# Patient Record
Sex: Male | Born: 1935 | Race: White | Hispanic: No | Smoking: Former smoker
Health system: Southern US, Community
[De-identification: ages and names within clinical notes are randomized; demographics above are authoritative.]

## PROBLEM LIST (undated history)

## (undated) DIAGNOSIS — I2510 Atherosclerotic heart disease of native coronary artery without angina pectoris: Secondary | ICD-10-CM

## (undated) DIAGNOSIS — F0280 Dementia in other diseases classified elsewhere without behavioral disturbance: Secondary | ICD-10-CM

## (undated) HISTORY — PX: CORONARY ANGIOPLASTY WITH STENT PLACEMENT: SHX49

---

## 1999-03-02 DIAGNOSIS — M6281 Muscle weakness (generalized): Secondary | ICD-10-CM | POA: Insufficient documentation

## 1999-03-02 DIAGNOSIS — Z955 Presence of coronary angioplasty implant and graft: Secondary | ICD-10-CM | POA: Insufficient documentation

## 2005-03-01 HISTORY — PX: IR TRANSCATHETER BX: IMG713

## 2009-03-01 HISTORY — PX: OTHER SURGICAL HISTORY: SHX169

## 2015-09-17 DIAGNOSIS — N400 Enlarged prostate without lower urinary tract symptoms: Secondary | ICD-10-CM | POA: Insufficient documentation

## 2016-02-16 DIAGNOSIS — E782 Mixed hyperlipidemia: Secondary | ICD-10-CM | POA: Insufficient documentation

## 2016-02-16 DIAGNOSIS — F411 Generalized anxiety disorder: Secondary | ICD-10-CM | POA: Insufficient documentation

## 2017-03-08 DIAGNOSIS — I10 Essential (primary) hypertension: Secondary | ICD-10-CM | POA: Insufficient documentation

## 2017-03-11 DIAGNOSIS — F1490 Cocaine use, unspecified, uncomplicated: Secondary | ICD-10-CM | POA: Insufficient documentation

## 2019-07-17 ENCOUNTER — Encounter (HOSPITAL_COMMUNITY): Payer: Self-pay

## 2019-07-17 ENCOUNTER — Emergency Department (HOSPITAL_COMMUNITY)
Admission: EM | Admit: 2019-07-17 | Discharge: 2019-07-17 | Disposition: A | Discharge status: Home / Self Care | Payer: Medicare Other | Attending: Emergency Medicine | Admitting: Emergency Medicine

## 2019-07-17 ENCOUNTER — Other Ambulatory Visit: Payer: Self-pay

## 2019-07-17 ENCOUNTER — Emergency Department (HOSPITAL_COMMUNITY): Payer: Medicare Other

## 2019-07-17 DIAGNOSIS — R079 Chest pain, unspecified: Secondary | ICD-10-CM

## 2019-07-17 DIAGNOSIS — R0789 Other chest pain: Secondary | ICD-10-CM | POA: Diagnosis not present

## 2019-07-17 DIAGNOSIS — Z87891 Personal history of nicotine dependence: Secondary | ICD-10-CM | POA: Insufficient documentation

## 2019-07-17 LAB — COMPREHENSIVE METABOLIC PANEL
ALT: 53 U/L — ABNORMAL HIGH (ref 0–44)
AST: 54 U/L — ABNORMAL HIGH (ref 15–41)
Albumin: 3.9 g/dL (ref 3.5–5.0)
Alkaline Phosphatase: 71 U/L (ref 38–126)
Anion gap: 10 (ref 5–15)
BUN: 19 mg/dL (ref 8–23)
CO2: 26 mmol/L (ref 22–32)
Calcium: 9.1 mg/dL (ref 8.9–10.3)
Chloride: 103 mmol/L (ref 98–111)
Creatinine, Ser: 1.33 mg/dL — ABNORMAL HIGH (ref 0.61–1.24)
GFR calc Af Amer: 57 mL/min — ABNORMAL LOW (ref >60)
GFR calc non Af Amer: 49 mL/min — ABNORMAL LOW (ref >60)
Glucose, Bld: 92 mg/dL (ref 70–99)
Potassium: 4.4 mmol/L (ref 3.5–5.1)
Sodium: 139 mmol/L (ref 135–145)
Total Bilirubin: 1 mg/dL (ref 0.3–1.2)
Total Protein: 7.5 g/dL (ref 6.5–8.1)

## 2019-07-17 LAB — CBC WITH DIFFERENTIAL/PLATELET
Abs Immature Granulocytes: 0.05 10*3/uL (ref 0.00–0.07)
Basophils Absolute: 0.1 10*3/uL (ref 0.0–0.1)
Basophils Relative: 1 %
Eosinophils Absolute: 0.2 10*3/uL (ref 0.0–0.5)
Eosinophils Relative: 3 %
HCT: 52.3 % — ABNORMAL HIGH (ref 39.0–52.0)
Hemoglobin: 16.9 g/dL (ref 13.0–17.0)
Immature Granulocytes: 1 %
Lymphocytes Relative: 39 %
Lymphs Abs: 3.1 10*3/uL (ref 0.7–4.0)
MCH: 30.6 pg (ref 26.0–34.0)
MCHC: 32.3 g/dL (ref 30.0–36.0)
MCV: 94.6 fL (ref 80.0–100.0)
Monocytes Absolute: 0.7 10*3/uL (ref 0.1–1.0)
Monocytes Relative: 10 %
Neutro Abs: 3.7 10*3/uL (ref 1.7–7.7)
Neutrophils Relative %: 46 %
Platelets: 200 10*3/uL (ref 150–400)
RBC: 5.53 MIL/uL (ref 4.22–5.81)
RDW: 12 % (ref 11.5–15.5)
WBC: 7.8 10*3/uL (ref 4.0–10.5)
nRBC: 0 % (ref 0.0–0.2)

## 2019-07-17 LAB — TROPONIN I (HIGH SENSITIVITY)
Troponin I (High Sensitivity): 5 ng/L (ref <18)
Troponin I (High Sensitivity): 5 ng/L (ref <18)

## 2019-07-17 MED ORDER — PANTOPRAZOLE SODIUM 40 MG PO TBEC
40.0000 mg | DELAYED_RELEASE_TABLET | Freq: Once | ORAL | Status: AC
  Administered 2019-07-17: 40 mg via ORAL
  Filled 2019-07-17: qty 1

## 2019-07-17 MED ORDER — PANTOPRAZOLE SODIUM 40 MG PO TBEC
40.0000 mg | DELAYED_RELEASE_TABLET | Freq: Every day | ORAL | 1 refills | Status: DC
Start: 2019-07-17 — End: 2019-08-06

## 2019-07-17 NOTE — Discharge Instructions (Addendum)
Take protonix as directed.  Return if any problems

## 2019-07-17 NOTE — ED Triage Notes (Signed)
Pt reports for the past few days has been waking up with indigestion.  Says symptoms get better after he gets up and moves around for a while.  Reports his step daughter checked his bp and reports it was low.  Pt unaware of what his bp reading was.  Pt says he feels "fine."  Denies any complaints.

## 2019-07-17 NOTE — ED Provider Notes (Signed)
[facility] EMERGENCY DEPARTMENT Provider Note   CSN: [id number] Arrival date & time: 07/17/19  1528     History Chief Complaint  Patient presents with  . Chest Pain    Frank West is a 84 y.o. male.  The history is provided by the patient. No language interpreter was used.  Chest Pain Pain location:  R chest Pain quality: aching   Pain radiates to:  Epigastrium Timing:  Sporadic Progression:  Resolved Chronicity:  Recurrent Context comment:  Waking from sleep Relieved by:  Nothing Worsened by:  Nothing Ineffective treatments:  None tried Associated symptoms: no abdominal pain and no altered mental status   Risk factors: no diabetes mellitus and no hypertension   Pt complains of having a discomfort in his chest in the am.      History reviewed. No pertinent past medical history.  There are no problems to display for this patient.   Past Surgical History:  Procedure Laterality Date  . CORONARY ANGIOPLASTY WITH STENT PLACEMENT         No family history on file.  Social History   Tobacco Use  . Smoking status: Former Research scientist (life sciences)  . Smokeless tobacco: Never Used  Substance Use Topics  . Alcohol use: Never  . Drug use: Never    Home Medications Prior to Admission medications   Medication Sig Start Date End Date Taking? Authorizing Provider  pantoprazole (PROTONIX) 40 MG tablet Take 1 tablet (40 mg total) by mouth daily. 07/17/19 07/16/20  Fransico Meadow, PA-C    Allergies    Patient has no known allergies.  Review of Systems   Review of Systems  Cardiovascular: Positive for chest pain.  Gastrointestinal: Negative for abdominal pain.  All other systems reviewed and are negative.   Physical Exam Updated Vital Signs BP (!) 156/89   Pulse 66   Temp 98.2 F (36.8 C) (Oral)   Resp 19   Ht 5\' 11"  (1.803 m)   Wt 81.6 kg   SpO2 99%   BMI 25.10 kg/m   Physical Exam Vitals and nursing note reviewed.  Constitutional:      Appearance: He is  well-developed.  HENT:     Head: Normocephalic.  Cardiovascular:     Rate and Rhythm: Normal rate.     Heart sounds: Normal heart sounds.  Pulmonary:     Effort: Pulmonary effort is normal.     Breath sounds: Normal breath sounds.  Abdominal:     General: There is no distension.  Musculoskeletal:        General: Normal range of motion.     Cervical back: Normal range of motion.  Skin:    General: Skin is warm.  Neurological:     General: No focal deficit present.     Mental Status: He is alert and oriented to person, place, and time.     ED Results / Procedures / Treatments   Labs (all labs ordered are listed, but only abnormal results are displayed) Labs Reviewed  CBC WITH DIFFERENTIAL/PLATELET - Abnormal; Notable for the following components:      Result Value   HCT 52.3 (*)    All other components within normal limits  COMPREHENSIVE METABOLIC PANEL - Abnormal; Notable for the following components:   Creatinine, Ser 1.33 (*)    AST 54 (*)    ALT 53 (*)    GFR calc non Af Amer 49 (*)    GFR calc Af Amer 57 (*)    All other  components within normal limits  TROPONIN I (HIGH SENSITIVITY)  TROPONIN I (HIGH SENSITIVITY)    EKG EKG Interpretation  Date/Time:  Tuesday Jul 17 2019 15:59:13 EDT Ventricular Rate:  65 PR Interval:    QRS Duration: 94 QT Interval:  440 QTC Calculation: 458 R Axis:   -37 Text Interpretation: Sinus rhythm Prolonged PR interval Left axis deviation Low voltage, precordial leads Posterior infarct, old Baseline wander in lead(s) V6 No STEMI Confirmed by Alona Bene [phone]) on 07/17/2019 4:13:59 PM   Radiology DG Chest Port 1 View  Result Date: 07/17/2019 CLINICAL DATA:  Chest pain EXAM: PORTABLE CHEST 1 VIEW COMPARISON:  None. FINDINGS: 1707 hours. Low lung volumes. The cardiopericardial silhouette is within normal limits for size. Interstitial markings are diffusely coarsened with chronic features. The lungs are clear without focal pneumonia,  edema, pneumothorax or pleural effusion. The visualized bony structures of the thorax are intact. Telemetry leads overlie the chest. IMPRESSION: No active disease. Electronically Signed   By: Kennith Center M.D.   On: 07/17/2019 17:24    Procedures Procedures (including critical care time)  Medications Ordered in ED Medications  pantoprazole (PROTONIX) EC tablet 40 mg (40 mg Oral Given 07/17/19 1851)    ED Course  I have reviewed the triage vital signs and the nursing notes.  Pertinent labs & imaging results that were available during my care of the patient were reviewed by me and considered in my medical decision making (see chart for details).    MDM Rules/Calculators/A&P                      MDM: Pt's ekg no acute, chest xray is normal labs are normal.  Dr. Jacqulyn Bath in to see and examine   Pt referred to cardiology for on going care.  Final Clinical Impression(s) / ED Diagnoses Final diagnoses:  Nonspecific chest pain    Rx / DC Orders ED Discharge Orders         Ordered    Ambulatory referral to Cardiology    Comments: Please call daughter [name] at [phone] to schedule appointment.   07/17/19 1829    pantoprazole (PROTONIX) 40 MG tablet  Daily     07/17/19 1837        An After Visit Summary was printed and given to the patient.    Elson Areas, Cordelia Poche 07/17/19 2306    Maia Plan, MD 07/18/19 [phone]

## 2019-07-17 NOTE — ED Notes (Signed)
Pt reports he had a stent placed a few years ago and was put on what he thinks was a blood thinner.  Pt says he stopped taking them when he ran out 2 years ago.  Pt says he doesn't have a doctor.

## 2019-08-06 ENCOUNTER — Other Ambulatory Visit: Payer: Self-pay

## 2019-08-06 ENCOUNTER — Ambulatory Visit (INDEPENDENT_AMBULATORY_CARE_PROVIDER_SITE_OTHER): Payer: Medicare Other | Admitting: Cardiology

## 2019-08-06 ENCOUNTER — Encounter: Payer: Self-pay | Admitting: Cardiology

## 2019-08-06 ENCOUNTER — Encounter: Payer: Self-pay | Admitting: *Deleted

## 2019-08-06 VITALS — BP 116/84 | HR 75 | Ht 71.5 in | Wt 199.2 lb

## 2019-08-06 DIAGNOSIS — I2510 Atherosclerotic heart disease of native coronary artery without angina pectoris: Secondary | ICD-10-CM

## 2019-08-06 DIAGNOSIS — Z955 Presence of coronary angioplasty implant and graft: Secondary | ICD-10-CM

## 2019-08-06 NOTE — Progress Notes (Signed)
     Clinical Summary Mr. Noguez is a 84 y.o.male seen today as a new patient for the following medical problems.    1. Chest pain - seen in ER 06/2019 with chest pain - trops neg x 2. EKG SR, no acute ischemic changes. CXR no acute process  - he reports stent placed in [place], he believes [place]. He reports received a stent. Believes was placed in 2017.   - he reports he did not have chest pain for ER visit.  - nonspecific feeling midchest/epigastrium, no other symptoms. Better with walking around. Lasted a few minutes. Mainly happens with waking up in the morning - walks dog regularly 3-4 per day, up to 30 minutes. No exertional symptoms - did not start protonix        PMH - self reported history of CAD   No Known Allergies   Current Outpatient Medications  Medication Sig Dispense Refill  . pantoprazole (PROTONIX) 40 MG tablet Take 1 tablet (40 mg total) by mouth daily. 30 tablet 1   No current facility-administered medications for this visit.     Past Surgical History:  Procedure Laterality Date  . CORONARY ANGIOPLASTY WITH STENT PLACEMENT       No Known Allergies    No family history on file.   Social History Mr. Desroches reports that he has quit smoking. He has never used smokeless tobacco. Mr. Auxier reports no history of alcohol use.   Review of Systems CONSTITUTIONAL: No weight loss, fever, chills, weakness or fatigue.  HEENT: Eyes: No visual loss, blurred vision, double vision or yellow sclerae.No hearing loss, sneezing, congestion, runny nose or sore throat.  SKIN: No rash or itching.  CARDIOVASCULAR: per hpi RESPIRATORY: No shortness of breath, cough or sputum.  GASTROINTESTINAL: No anorexia, nausea, vomiting or diarrhea. No abdominal pain or blood.  GENITOURINARY: No burning on urination, no polyuria NEUROLOGICAL: No headache, dizziness, syncope, paralysis, ataxia, numbness or tingling in the extremities. No change in bowel or bladder  control.  MUSCULOSKELETAL: No muscle, back pain, joint pain or stiffness.  LYMPHATICS: No enlarged nodes. No history of splenectomy.  PSYCHIATRIC: No history of depression or anxiety.  ENDOCRINOLOGIC: No reports of sweating, cold or heat intolerance. No polyuria or polydipsia.  Marland Kitchen   Physical Examination Today's Vitals   08/06/19 1500  BP: 116/84  Pulse: 75  SpO2: 95%  Weight: 199 lb 3.2 oz (90.4 kg)  Height: 5' 11.5" (1.816 m)   Body mass index is 27.4 kg/m.  Gen: resting comfortably, no acute distress HEENT: no scleral icterus, pupils equal round and reactive, no palptable cervical adenopathy,  CV: RRR, no m/r/g, no jvd Resp: Clear to auscultation bilaterally GI: abdomen is soft, non-tender, non-distended, normal bowel sounds, no hepatosplenomegaly MSK: extremities are warm, no edema.  Skin: warm, no rash Neuro:  no focal deficits Psych: appropriate affect     Assessment and Plan  1. Chest pain/CAD - self reported history of CAD, reports stent around 2017 in [place]. He does not know any details, thinks may have been either in [place] or [place]. We will see if we are able to hunt anything down - recent symptoms are non cardiac, no plans for ischemic testing - he is not on any meds. Start ASA 81mg  daily. Check lipid panel, will need a statin pending results.          , M.D

## 2019-08-06 NOTE — Patient Instructions (Addendum)
Your physician wants you to follow-up in: 6 MONTHS WITH [facility]  Your physician has recommended you make the following change in your medication:  START ASPIRIN 81 MG DAILY   Your physician recommends that you return for lab work LIPIDS - PLEASE FAST 6-8 HOURS PRIOR TO LAB WORK   Thank you for choosing [facility]!!

## 2020-02-21 ENCOUNTER — Ambulatory Visit: Payer: Medicare Other | Admitting: Cardiology

## 2020-02-21 NOTE — Progress Notes (Deleted)
     Clinical Summary Frank West is a 84 y.o.male   1. Chest pain - seen in ER 06/2019 with chest pain - trops neg x 2. EKG SR, no acute ischemic changes. CXR no acute process  - he reports stent placed in [place], he believes [place]. He reports received a stent. Believes was placed in 2017.   - he reports he did not have chest pain for ER visit.  - nonspecific feeling midchest/epigastrium, no other symptoms. Better with walking around. Lasted a few minutes. Mainly happens with waking up in the morning - walks dog regularly 3-4 per day, up to 30 minutes. No exertional symptoms - did not start protonix  No past medical history on file.   No Known Allergies   Current Outpatient Medications  Medication Sig Dispense Refill  . aspirin EC 81 MG tablet Take 81 mg by mouth daily.     No current facility-administered medications for this visit.     Past Surgical History:  Procedure Laterality Date  . CORONARY ANGIOPLASTY WITH STENT PLACEMENT    . IR TRANSCATHETER BX  2007   done in [place]   . uroscopy  2011     No Known Allergies    Family History  Problem Relation Age of Onset  . Melanoma Father   . Obesity Brother      Social History [name] reports that he has quit smoking. He has never used smokeless tobacco. [name] reports no history of alcohol use.   Review of Systems CONSTITUTIONAL: No weight loss, fever, chills, weakness or fatigue.  HEENT: Eyes: No visual loss, blurred vision, double vision or yellow sclerae.No hearing loss, sneezing, congestion, runny nose or sore throat.  SKIN: No rash or itching.  CARDIOVASCULAR:  RESPIRATORY: No shortness of breath, cough or sputum.  GASTROINTESTINAL: No anorexia, nausea, vomiting or diarrhea. No abdominal pain or blood.  GENITOURINARY: No burning on urination, no polyuria NEUROLOGICAL: No headache, dizziness, syncope, paralysis, ataxia, numbness or tingling in the extremities. No change in bowel or bladder  control.  MUSCULOSKELETAL: No muscle, back pain, joint pain or stiffness.  LYMPHATICS: No enlarged nodes. No history of splenectomy.  PSYCHIATRIC: No history of depression or anxiety.  ENDOCRINOLOGIC: No reports of sweating, cold or heat intolerance. No polyuria or polydipsia.  Marland Kitchen   Physical Examination There were no vitals filed for this visit. There were no vitals filed for this visit.  Gen: resting comfortably, no acute distress HEENT: no scleral icterus, pupils equal round and reactive, no palptable cervical adenopathy,  CV Resp: Clear to auscultation bilaterally GI: abdomen is soft, non-tender, non-distended, normal bowel sounds, no hepatosplenomegaly MSK: extremities are warm, no edema.  Skin: warm, no rash Neuro:  no focal deficits Psych: appropriate affect   Diagnostic Studies     Assessment and Plan  1. Chest pain/CAD - self reported history of CAD, reports stent around 2017 in [place]. He does not know any details, thinks may have been either in [place] or [place]. We will see if we are able to hunt anything down - recent symptoms are non cardiac, no plans for ischemic testing - he is not on any meds. Start ASA 81mg  daily. Check lipid panel, will need a statin pending results.       , M.D., F.A.C.C.

## 2021-07-14 IMAGING — DX DG CHEST 1V PORT
1 series · 1 of 1 positions shown · non-contrast
Comparison: None.

CLINICAL DATA: Chest pain

EXAM:
PORTABLE CHEST 1 VIEW

[chest ap]
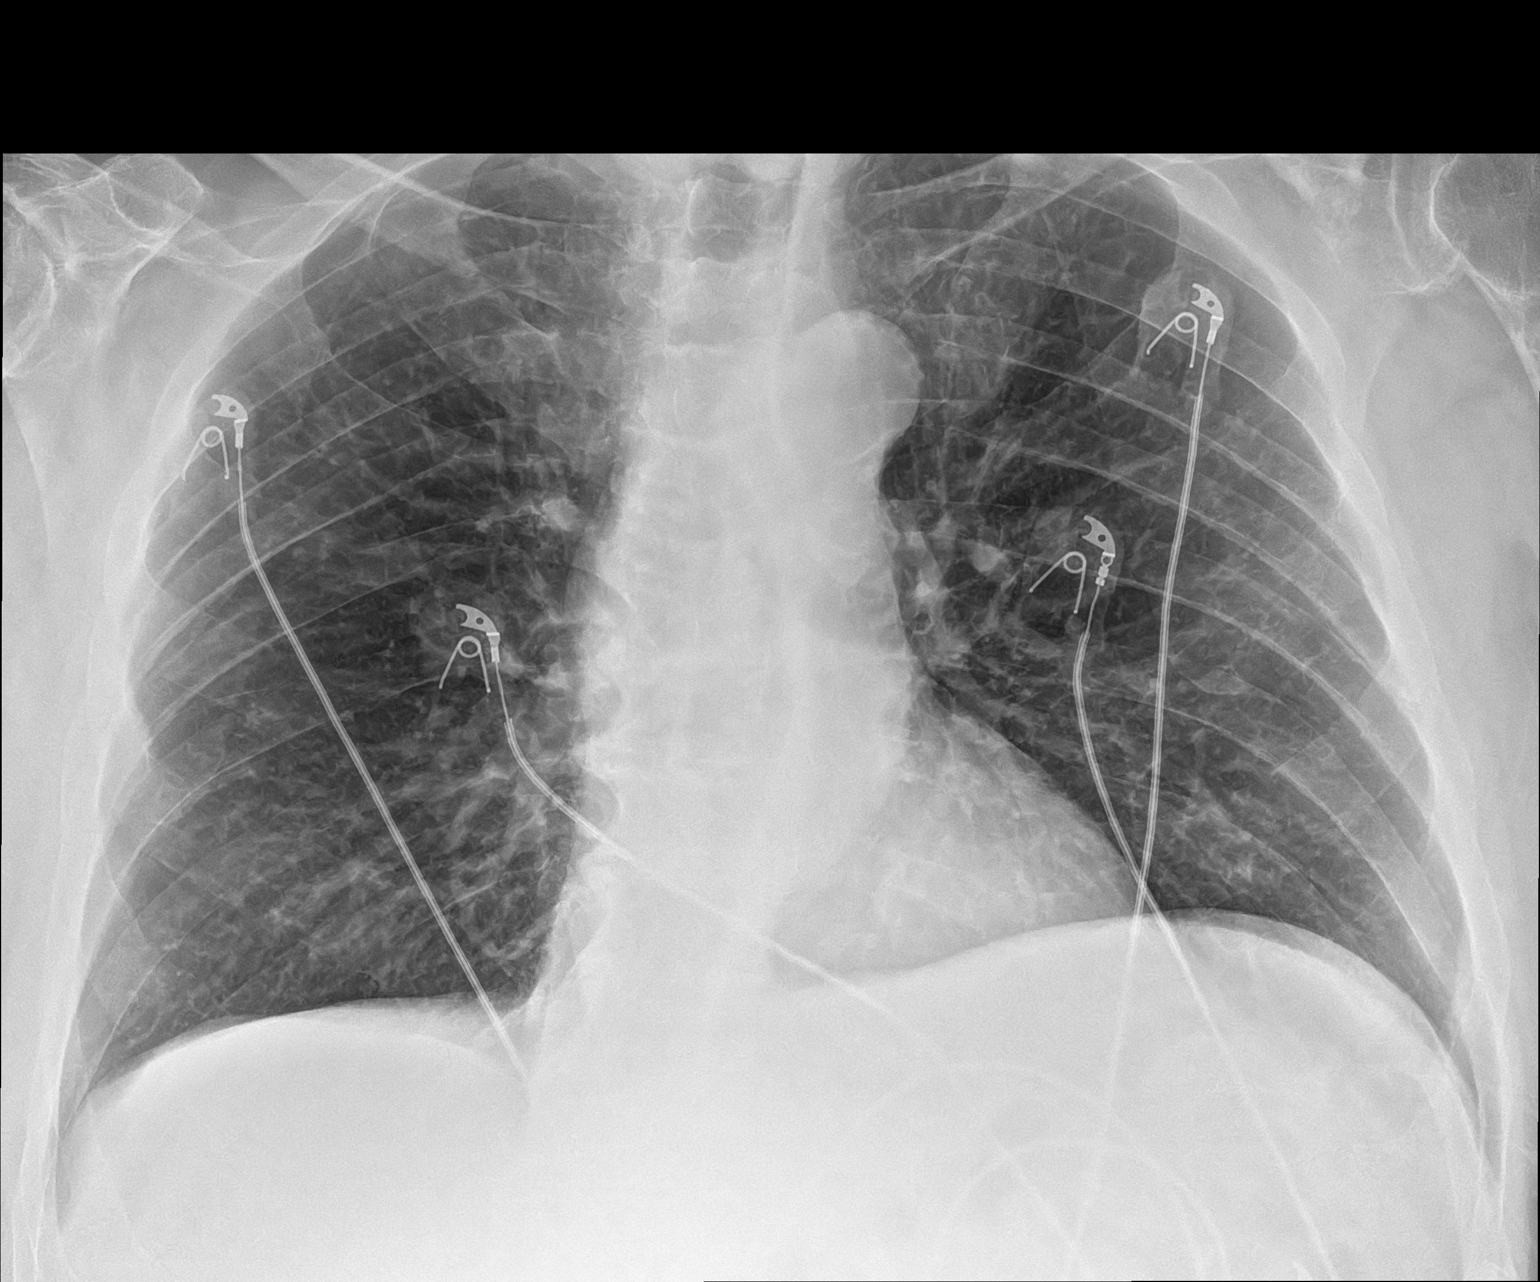

[1 of 1 positions shown; findings below may reference images not displayed]

FINDINGS: 2909 hours. Low lung volumes. The cardiopericardial silhouette is
within normal limits for size. Interstitial markings are diffusely
coarsened with chronic features. The lungs are clear without focal
pneumonia, edema, pneumothorax or pleural effusion. The visualized
bony structures of the thorax are intact. Telemetry leads overlie
the chest.
IMPRESSION: No active disease.

## 2022-09-23 ENCOUNTER — Other Ambulatory Visit: Payer: Self-pay

## 2022-09-23 ENCOUNTER — Emergency Department (HOSPITAL_COMMUNITY): Payer: Medicare Other

## 2022-09-23 ENCOUNTER — Inpatient Hospital Stay (HOSPITAL_COMMUNITY)
Admission: EM | Admit: 2022-09-23 | Discharge: 2022-09-29 | DRG: 522 | Disposition: A | Discharge status: Skilled Nursing Facility | Payer: Medicare Other | Attending: Family Medicine | Admitting: Family Medicine

## 2022-09-23 ENCOUNTER — Encounter (HOSPITAL_COMMUNITY): Payer: Self-pay | Admitting: Emergency Medicine

## 2022-09-23 DIAGNOSIS — W010XXA Fall on same level from slipping, tripping and stumbling without subsequent striking against object, initial encounter: Secondary | ICD-10-CM | POA: Diagnosis present

## 2022-09-23 DIAGNOSIS — R7401 Elevation of levels of liver transaminase levels: Secondary | ICD-10-CM | POA: Diagnosis present

## 2022-09-23 DIAGNOSIS — G308 Other Alzheimer's disease: Secondary | ICD-10-CM | POA: Diagnosis not present

## 2022-09-23 DIAGNOSIS — F02A0 Dementia in other diseases classified elsewhere, mild, without behavioral disturbance, psychotic disturbance, mood disturbance, and anxiety: Secondary | ICD-10-CM | POA: Diagnosis present

## 2022-09-23 DIAGNOSIS — I493 Ventricular premature depolarization: Secondary | ICD-10-CM | POA: Diagnosis present

## 2022-09-23 DIAGNOSIS — Z87891 Personal history of nicotine dependence: Secondary | ICD-10-CM | POA: Diagnosis not present

## 2022-09-23 DIAGNOSIS — Z955 Presence of coronary angioplasty implant and graft: Secondary | ICD-10-CM | POA: Diagnosis not present

## 2022-09-23 DIAGNOSIS — D696 Thrombocytopenia, unspecified: Secondary | ICD-10-CM | POA: Diagnosis present

## 2022-09-23 DIAGNOSIS — R9431 Abnormal electrocardiogram [ECG] [EKG]: Secondary | ICD-10-CM | POA: Diagnosis present

## 2022-09-23 DIAGNOSIS — Z91199 Patient's noncompliance with other medical treatment and regimen due to unspecified reason: Secondary | ICD-10-CM | POA: Diagnosis not present

## 2022-09-23 DIAGNOSIS — I2510 Atherosclerotic heart disease of native coronary artery without angina pectoris: Secondary | ICD-10-CM | POA: Diagnosis present

## 2022-09-23 DIAGNOSIS — G309 Alzheimer's disease, unspecified: Secondary | ICD-10-CM | POA: Diagnosis present

## 2022-09-23 DIAGNOSIS — R079 Chest pain, unspecified: Secondary | ICD-10-CM | POA: Diagnosis not present

## 2022-09-23 DIAGNOSIS — S72001A Fracture of unspecified part of neck of right femur, initial encounter for closed fracture: Secondary | ICD-10-CM | POA: Diagnosis not present

## 2022-09-23 DIAGNOSIS — F0280 Dementia in other diseases classified elsewhere without behavioral disturbance: Secondary | ICD-10-CM | POA: Diagnosis present

## 2022-09-23 DIAGNOSIS — S72011A Unspecified intracapsular fracture of right femur, initial encounter for closed fracture: Secondary | ICD-10-CM | POA: Diagnosis present

## 2022-09-23 DIAGNOSIS — Y92009 Unspecified place in unspecified non-institutional (private) residence as the place of occurrence of the external cause: Secondary | ICD-10-CM

## 2022-09-23 DIAGNOSIS — F0284 Dementia in other diseases classified elsewhere, unspecified severity, with anxiety: Secondary | ICD-10-CM | POA: Diagnosis not present

## 2022-09-23 DIAGNOSIS — F0390 Unspecified dementia without behavioral disturbance: Secondary | ICD-10-CM | POA: Diagnosis not present

## 2022-09-23 DIAGNOSIS — Z01810 Encounter for preprocedural cardiovascular examination: Secondary | ICD-10-CM | POA: Diagnosis not present

## 2022-09-23 DIAGNOSIS — R008 Other abnormalities of heart beat: Secondary | ICD-10-CM | POA: Diagnosis present

## 2022-09-23 DIAGNOSIS — W19XXXA Unspecified fall, initial encounter: Secondary | ICD-10-CM | POA: Diagnosis not present

## 2022-09-23 HISTORY — DX: Atherosclerotic heart disease of native coronary artery without angina pectoris: I25.10

## 2022-09-23 HISTORY — DX: Dementia in other diseases classified elsewhere, unspecified severity, without behavioral disturbance, psychotic disturbance, mood disturbance, and anxiety: F02.80

## 2022-09-23 LAB — TYPE AND SCREEN
ABO/RH(D): O POS
Antibody Screen: NEGATIVE

## 2022-09-23 LAB — CBC WITH DIFFERENTIAL/PLATELET
Abs Immature Granulocytes: 0.1 10*3/uL — ABNORMAL HIGH (ref 0.00–0.07)
Basophils Absolute: 0.1 10*3/uL (ref 0.0–0.1)
Basophils Relative: 0 %
Eosinophils Absolute: 0.3 10*3/uL (ref 0.0–0.5)
Eosinophils Relative: 2 %
HCT: 50.4 % (ref 39.0–52.0)
Hemoglobin: 16.6 g/dL (ref 13.0–17.0)
Immature Granulocytes: 1 %
Lymphocytes Relative: 7 %
Lymphs Abs: 1 10*3/uL (ref 0.7–4.0)
MCH: 30.6 pg (ref 26.0–34.0)
MCHC: 32.9 g/dL (ref 30.0–36.0)
MCV: 93 fL (ref 80.0–100.0)
Monocytes Absolute: 0.8 10*3/uL (ref 0.1–1.0)
Monocytes Relative: 6 %
Neutro Abs: 12.5 10*3/uL — ABNORMAL HIGH (ref 1.7–7.7)
Neutrophils Relative %: 84 %
Platelets: 165 10*3/uL (ref 150–400)
RBC: 5.42 MIL/uL (ref 4.22–5.81)
RDW: 12.7 % (ref 11.5–15.5)
WBC: 14.7 10*3/uL — ABNORMAL HIGH (ref 4.0–10.5)
nRBC: 0 % (ref 0.0–0.2)

## 2022-09-23 LAB — PROTIME-INR
INR: 1.1 (ref 0.8–1.2)
Prothrombin Time: 14 seconds (ref 11.4–15.2)

## 2022-09-23 LAB — COMPREHENSIVE METABOLIC PANEL
ALT: 59 U/L — ABNORMAL HIGH (ref 0–44)
AST: 63 U/L — ABNORMAL HIGH (ref 15–41)
Albumin: 3.8 g/dL (ref 3.5–5.0)
Alkaline Phosphatase: 71 U/L (ref 38–126)
Anion gap: 11 (ref 5–15)
BUN: 15 mg/dL (ref 8–23)
CO2: 22 mmol/L (ref 22–32)
Calcium: 8.9 mg/dL (ref 8.9–10.3)
Chloride: 103 mmol/L (ref 98–111)
Creatinine, Ser: 1.21 mg/dL (ref 0.61–1.24)
GFR, Estimated: 59 mL/min — ABNORMAL LOW (ref >60)
Glucose, Bld: 125 mg/dL — ABNORMAL HIGH (ref 70–99)
Potassium: 4.1 mmol/L (ref 3.5–5.1)
Sodium: 136 mmol/L (ref 135–145)
Total Bilirubin: 1.2 mg/dL (ref 0.3–1.2)
Total Protein: 7.8 g/dL (ref 6.5–8.1)

## 2022-09-23 LAB — CK: Total CK: 86 U/L (ref 49–397)

## 2022-09-23 MED ORDER — POLYETHYLENE GLYCOL 3350 17 G PO PACK: 17.0000 g | PACK | Freq: Every day | ORAL | Status: DC | PRN

## 2022-09-23 MED ORDER — ACETAMINOPHEN 650 MG RE SUPP: 650.0000 mg | Freq: Four times a day (QID) | RECTAL | Status: DC | PRN

## 2022-09-23 MED ORDER — SODIUM CHLORIDE 0.9 % IV SOLN: INTRAVENOUS | Status: AC

## 2022-09-23 MED ORDER — MORPHINE SULFATE (PF) 2 MG/ML IV SOLN
2.0000 mg | INTRAVENOUS | Status: DC | PRN
  Administered 2022-09-23 (×2): 2 mg via INTRAVENOUS
  Filled 2022-09-23 (×2): qty 1

## 2022-09-23 MED ORDER — ACETAMINOPHEN 325 MG PO TABS
650.0000 mg | ORAL_TABLET | Freq: Four times a day (QID) | ORAL | Status: DC | PRN
  Administered 2022-09-26: 650 mg via ORAL
  Filled 2022-09-23: qty 2

## 2022-09-23 MED ORDER — MORPHINE SULFATE (PF) 2 MG/ML IV SOLN
2.0000 mg | Freq: Once | INTRAVENOUS | Status: AC
  Administered 2022-09-23: 2 mg via INTRAVENOUS
  Filled 2022-09-23: qty 1

## 2022-09-23 MED ORDER — OXYCODONE-ACETAMINOPHEN 5-325 MG PO TABS
1.0000 | ORAL_TABLET | Freq: Once | ORAL | Status: AC
  Administered 2022-09-23: 1 via ORAL
  Filled 2022-09-23: qty 1

## 2022-09-23 MED ORDER — HEPARIN SODIUM (PORCINE) 5000 UNIT/ML IJ SOLN
5000.0000 [IU] | Freq: Once | INTRAMUSCULAR | Status: AC
  Administered 2022-09-23: 5000 [IU] via SUBCUTANEOUS
  Filled 2022-09-23: qty 1

## 2022-09-23 NOTE — ED Notes (Signed)
Pt wet, cleaned and new linens. Male purewick applied. Pt restless and pain with slight movement. Trembling. Able to console. Asked dr for more pain meds.

## 2022-09-23 NOTE — Assessment & Plan Note (Deleted)
Status post mechanical fall.  CT right hip without contrast- Subcapital impaction fracture of the right femoral neck with superolateral displacement of the distal femur.At baseline ambulates without assistance or assistive devices. - EDP consulted Dr. Romeo Apple- will need right hip partial hip replacement bipolar replacement - NPO midnight - IV morphine 2mg  Q4hr prn - N/s 75cc/hr x 20hrs -Daughter is unaware of any cardiac history, patient denies cardiac history at this time, but has dementia.  Cardiology notes 2021-history of cardiac stents in [place]. 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

## 2022-09-23 NOTE — ED Notes (Signed)
Pt in ct 

## 2022-09-23 NOTE — ED Provider Notes (Signed)
[place] EMERGENCY DEPARTMENT AT [facility] Provider Note   CSN: [id number] Arrival date & time: 09/23/22  1231     History Chief Complaint  Patient presents with   Frank West is a 87 y.o. male.  Patient with past history significant for Alzheimer disease presents to the emergency department with concerns of a mechanical fall.  Reports that he tripped and fell and landed against a wall earlier this morning when he tried to get his dog outside. This was an unwitnessed fall at home. Endorses pain of the right hip and knee.  Patient's daughter is at bedside reports that patient has had 2 episodes of vomiting earlier this morning but unsure if this is related to the fall.  Patient's daughter reports that he was last seen around 9:30 PM last night and was found this morning on the floor around 9 AM.  Per doctor's report, patient is alert and oriented at baseline.   Fall       Home Medications Prior to Admission medications   Medication Sig Start Date End Date Taking? Authorizing Provider  ibuprofen (ADVIL) 200 MG tablet Take 600 mg by mouth every 6 (six) hours as needed for moderate pain.   Yes [provider]      Allergies    Patient has no known allergies.    Review of Systems   Review of Systems  Musculoskeletal:  Positive for joint swelling.  All other systems reviewed and are negative.   Physical Exam Updated Vital Signs BP (!) 173/90 (BP Location: Left Arm)   Pulse 84   Temp 98.1 F (36.7 C) (Oral)   Resp 18   Ht 5' 11.5" (1.816 m)   Wt 81 kg   SpO2 99%   BMI 24.56 kg/m  Physical Exam Vitals and nursing note reviewed.  Constitutional:      General: He is not in acute distress.    Appearance: He is well-developed.  HENT:     Head: Normocephalic and atraumatic.  Eyes:     Conjunctiva/sclera: Conjunctivae normal.  Cardiovascular:     Rate and Rhythm: Normal rate and regular rhythm.     Heart sounds: No murmur  heard. Pulmonary:     Effort: Pulmonary effort is normal. No respiratory distress.     Breath sounds: Normal breath sounds.  Abdominal:     Palpations: Abdomen is soft.     Tenderness: There is no abdominal tenderness.  Musculoskeletal:        General: Tenderness and signs of injury present. No swelling or deformity.     Cervical back: Neck supple.     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  Skin:    General: Skin is warm and dry.     Capillary Refill: Capillary refill takes less than 2 seconds.     Findings: Lesion present.          Comments: Multiple skin tears noted to the right elbow.  Superficial abrasion noted to the right knee.    Neurological:     Mental Status: He is alert.  Psychiatric:        Mood and Affect: Mood normal.     ED Results / Procedures / Treatments   Labs (all labs ordered are listed, but only abnormal results are displayed) Labs Reviewed  CBC WITH DIFFERENTIAL/PLATELET - Abnormal; Notable for the following components:  Result Value   WBC 14.7 (*)    Neutro Abs 12.5 (*)    Abs Immature Granulocytes 0.10 (*)    All other components within normal limits  COMPREHENSIVE METABOLIC PANEL - Abnormal; Notable for the following components:   Glucose, Bld 125 (*)    AST 63 (*)    ALT 59 (*)    GFR, Estimated 59 (*)    All other components within normal limits  PROTIME-INR  CK  URINALYSIS, ROUTINE W REFLEX MICROSCOPIC  BASIC METABOLIC PANEL  CBC  TYPE AND SCREEN    EKG None  Radiology CT Hip Right Wo Contrast  Result Date: 09/23/2022 CLINICAL DATA:  Patient fell today.  Right hip pain EXAM: CT OF THE RIGHT HIP WITHOUT CONTRAST TECHNIQUE: Multidetector CT imaging of the right hip was performed according to the standard protocol. Multiplanar CT image reconstructions were also generated. RADIATION DOSE REDUCTION: This exam was performed according  to the departmental dose-optimization program which includes automated exposure control, adjustment of the mA and/or kV according to patient size and/or use of iterative reconstruction technique. COMPARISON:  Radiograph performed earlier on the same date FINDINGS: Bones/Joint/Cartilage Subcapital impaction fracture of the right femoral neck with superolateral displacement of the distal femur. No joint effusion or intra-articular extension. Mild hip osteoarthritis. Sacroiliac joint and pubic symphysis are intact. Pubic bones are intact. Ligaments Suboptimally assessed by CT. Muscles and Tendons No intramuscular hematoma or fluid collection. Iliopsoas and hamstring tendons appear intact. Soft tissues Skin and subcutaneous soft tissues are within normal limits. IMPRESSION: Subcapital impaction fracture of the right femoral neck with superolateral displacement of the distal femur. Electronically Signed   By: Larose Hires D.O.   On: 09/23/2022 16:08   DG Hip Unilat W or Wo Pelvis 2-3 Views Right  Result Date: 09/23/2022 CLINICAL DATA:  Post fall, now with right hip pain. EXAM: DG HIP (WITH OR WITHOUT PELVIS) 2-3V RIGHT COMPARISON:  None Available. FINDINGS: There is a acute, impacted, foreshortened subcapsular right femoral neck fracture without definitive evidence of intra-articular extension. Mild degenerative change of the right hip is suspected with joint space loss, subchondral sclerosis and osteophytosis. No evidence of avascular necrosis Limited visualization of the pelvis is normal. Degenerative change of the contralateral left hip is suspected though incompletely evaluated. Degenerative change of the lower lumbar spine is suspected though incompletely evaluated Phleboliths overlie the right hemipelvis. Regional soft tissues appear otherwise normal. IMPRESSION: Acute, impacted, foreshortened subcapsular right femoral neck fracture without definitive evidence of intra-articular extension. Electronically Signed    By: Simonne Come M.D.   On: 09/23/2022 15:15   DG Knee Complete 4 Views Right  Result Date: 09/23/2022 CLINICAL DATA:  Post fall, now with right knee pain. EXAM: RIGHT KNEE - COMPLETE 4+ VIEW COMPARISON:  None Available. FINDINGS: Sequela of previous pin, cerclage wire and lag screw fixation of the patella. The superior aspect of the cerclage wire is fractured though this is favored to be chronic in etiology given associated lucency surrounding the fracture component of the cerclage wire. No definite acute fracture or dislocation. No knee joint effusion. Mild-to-moderate tricompartmental degenerative change of the knee is suspected though suboptimally evaluated due to obliquity. No evidence of chondrocalcinosis. Regional soft tissues appear normal. IMPRESSION: 1. No acute findings. 2. Sequela of previous pin, cerclage wire and lag screw fixation of the patella. Electronically Signed   By: Simonne Come M.D.   On: 09/23/2022 15:13   CT Head Wo Contrast  Result Date: 09/23/2022 CLINICAL DATA:  Head trauma, moderate-severe EXAM: CT HEAD WITHOUT CONTRAST TECHNIQUE: Contiguous axial images were obtained from the base of the skull through the vertex without intravenous contrast. RADIATION DOSE REDUCTION: This exam was performed according to the departmental dose-optimization program which includes automated exposure control, adjustment of the mA and/or kV according to patient size and/or use of iterative reconstruction technique. COMPARISON:  None Available. FINDINGS: Brain: No evidence of acute infarction, hemorrhage, hydrocephalus, extra-axial collection or mass lesion/mass effect. Vascular: No hyperdense vessel or unexpected calcification. Skull: Normal. Negative for fracture or focal lesion. Sinuses/Orbits: No middle ear or mastoid effusion. Paranasal sinuses are clear. Orbits are unremarkable. Other: None. IMPRESSION: No CT evidence of intracranial injury. Electronically Signed   By: Lorenza Cambridge M.D.   On:  09/23/2022 14:30    Procedures Procedures   Medications Ordered in ED Medications  morphine (PF) 2 MG/ML injection 2 mg (2 mg Intravenous Given 09/23/22 2116)  0.9 %  sodium chloride infusion ( Intravenous New Bag/Given 09/23/22 2008)  acetaminophen (TYLENOL) tablet 650 mg (has no administration in time range)    Or  acetaminophen (TYLENOL) suppository 650 mg (has no administration in time range)  polyethylene glycol (MIRALAX / GLYCOLAX) packet 17 g (has no administration in time range)  oxyCODONE-acetaminophen (PERCOCET/ROXICET) 5-325 MG per tablet 1 tablet (1 tablet Oral Given 09/23/22 1419)  morphine (PF) 2 MG/ML injection 2 mg (2 mg Intravenous Given 09/23/22 1721)  heparin injection 5,000 Units (5,000 Units Subcutaneous Given 09/23/22 2100)    ED Course/ Medical Decision Making/ A&P Clinical Course as of 09/23/22 2324  Thu Sep 23, 2022  1458 DG Hip Unilat W or Wo Pelvis 2-3 Views Right [DR]  1459 DG Hip Unilat W or Wo Pelvis 2-3 Views Right [DR]    Clinical Course User Index [DR] Margarita Grizzle, MD                           Medical Decision Making Amount and/or Complexity of Data Reviewed Labs: ordered. Radiology: ordered. Decision-making details documented in ED Course.  Risk Prescription drug management. Decision regarding hospitalization.   This patient presents to the ED for concern of fall. Differential diagnosis includes hip fracture, femur fracture, hip dislocation, UTI, sepsis   Lab Tests:  I Ordered, and personally interpreted labs.  The pertinent results include: Mild leukocytosis of 14.7, CMP with out significant electrolyte derangements, renal function largely intact with only slight decline in GFR at 59, AST and ALT elevated with AST at 63 and ALT at 59   Imaging Studies ordered:  I ordered imaging studies including x-ray of the right knee, x-ray right hip, CT head, CT right hip I independently visualized and interpreted imaging which showed no acute  findings in the right knee, subcapsular right femoral neck fracture, no acute intracranial abnormality I agree with the radiologist interpretation   Medicines ordered and prescription drug management:  I ordered medication including Percocet for pain Reevaluation of the patient after these medicines showed that the patient improved I have reviewed the patients home medicines and have made adjustments as needed   Problem List / ED Course:  Patient presents to the emergency department following a mechanical fall.  Reports that he tripped and fell was try to take his dog outside earlier today.  States that he hit the right hip and right knee against a wall in the home.  Patient's daughter at bedside reports that he was found on the ground at around 68  AM this morning.  No obvious deformity noted on initial examination but there is tenderness to gentle palpation along the right greater trochanter.  Given unclear mechanism and unknown duration the patient was seen on the floor, will image with CT head and CT hip as well as x-ray imaging of these areas and the x-ray of the right knee for evaluation of patient's symptoms.  Will treat with Percocet for adequate pain control.  Fall appears to sound mechanical in nature but will order basic labs as well for evaluation for any signs of infection or him.  Will assess renal function for possible electrolyte deficiency. Imaging concerning for supcapsular right femoral neck fracture. Will consult surgery for recommendations but given patient's age, do no anticipate surgery. Spoke with Dr. Romeo Apple, orthopedic surgery, who advised admission given that patient cannot ambulate. Does not anticipate surgical management, but will evaluate once admitted. Will consult hospitalist. Spoke with Dr. Wendall Stade, hospitalist, who will be admitting patient for continued management given non-weight bearing status.   Final Clinical Impression(s) / ED Diagnoses Final diagnoses:   Closed subcapital fracture of right femur, initial encounter [facility])    Rx / DC Orders ED Discharge Orders     None         Smitty Knudsen, PA-C 09/23/22 2324    Margarita Grizzle, MD 09/25/22 [phone]

## 2022-09-23 NOTE — ED Triage Notes (Addendum)
Pt fell today, tripped. Pt arrived a/o to most. C/o pain to right hip and knee. Hx of dementia. Daughter at bedside. 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 No rotation noted.bilateral pedal pulses faint but present.skin tear noted to right posterior fa. Denies hitting head but daughter states had vomited twice this morning.  Pt daughter last saw him at 930pm last night and found him in floor at 9am.  Pupills perrla, moving all extremities, equal hand grips and symmetrical smile. Small abrasion noted to right knee

## 2022-09-23 NOTE — H&P (Signed)
History and Physical    Frank West:[id number] DOB: August 27, 1935 DOA: 09/23/2022  PCP: Patient, No Pcp Per   Patient coming from: Home  I have personally briefly reviewed patient's old medical records in [facility]  Chief Complaint: Fall  HPI: Frank West is a 87 y.o. male with medical history significant for Alzheimer's.  Patient presented to the ED with reports of a fall.  Patient was last seen normal at about 9:30 PM.  This morning patient's daughter woke up and saw patient on the floor at about 9 AM.  Patient had gotten up to let the dog out probably about 8 AM, and falling in his bedroom, had his bed.  Patient reports he tripped.  He did not lose consciousness.  He denies chest pain, no difficulty breathing.  Vomited twice this morning due to severe pain.  He ambulates without assistance or assistive devices. Daughter reports frequent urination over the past year.  He denies any cardiac history procedures in the past.  Per cardiology notes 07/2019- he had stent placed in [place].  Per daughterFrench West at bedside, she does not know the details or if patient had any cardiac history.  ED Course: Stable vitals.  Head CT negative for acute abnormality, knee x-ray unremarkable.  CT right hip without contrast-shows subcapital impacted fracture of the right femoral neck. EDP talked to Ortho Dr. Romeo Apple, will see in consult.  N.p.o. midnight.  Review of Systems: As per HPI all other systems reviewed and negative.  Past Medical History:  Diagnosis Date   Alzheimer disease [facility])     Past Surgical History:  Procedure Laterality Date   CORONARY ANGIOPLASTY WITH STENT PLACEMENT     IR TRANSCATHETER BX  2007   done in [place]    uroscopy  2011     reports that he has quit smoking. He has never used smokeless tobacco. He reports that he does not drink alcohol and does not use drugs.  No Known Allergies  Family History  Problem Relation Age of Onset   Melanoma Father    Obesity  Brother     Prior to Admission medications   Medication Sig Start Date End Date Taking? Authorizing Provider  ibuprofen (ADVIL) 200 MG tablet Take 600 mg by mouth every 6 (six) hours as needed for moderate pain.   Yes [provider]    Physical Exam: Vitals:   09/23/22 1430 09/23/22 1458 09/23/22 1630 09/23/22 1715  BP:  (!) 148/89 (!) 182/82   Pulse: 78 (!) 54 93   Resp: 18 16 (!) 21   Temp:      TempSrc:      SpO2: 97% 95%  96%    Constitutional: NAD, calm, comfortable Vitals:   09/23/22 1430 09/23/22 1458 09/23/22 1630 09/23/22 1715  BP:  (!) 148/89 (!) 182/82   Pulse: 78 (!) 54 93   Resp: 18 16 (!) 21   Temp:      TempSrc:      SpO2: 97% 95%  96%   Eyes: PERRL, lids and conjunctivae normal ENMT: Mucous membranes are moist.   Neck: normal, supple, no masses, no thyromegaly Respiratory: clear to auscultation bilaterally, no wheezing, no crackles. Normal respiratory effort. No accessory muscle use.  Cardiovascular: Regular rate and rhythm, no murmurs / rubs / gallops. No extremity edema.  Abdomen: no tenderness, no masses palpated. No hepatosplenomegaly. Bowel sounds positive.  Musculoskeletal: no clubbing / cyanosis. No joint deformity upper and lower extremities.  Skin:  no rashes, lesions, ulcers. No induration Neurologic: No Facial asymmetry, speech fluent, right lower extremity not tested, moving extremities spontaneously Psychiatric: . Alert and oriented x 3. Normal mood.  Answers questions but tends to repeat his statements  Labs on Admission: I have personally reviewed following labs and imaging studies  CBC: Recent Labs  Lab 09/23/22 1429  WBC 14.7*  NEUTROABS 12.5*  HGB 16.6  HCT 50.4  MCV 93.0  PLT 165   Basic Metabolic Panel: Recent Labs  Lab 09/23/22 1429  NA 136  K 4.1  CL 103  CO2 22  GLUCOSE 125*  BUN 15  CREATININE 1.21  CALCIUM 8.9   GFR: CrCl cannot be calculated (Unknown ideal weight.). Liver Function Tests: Recent  Labs  Lab 09/23/22 1429  AST 63*  ALT 59*  ALKPHOS 71  BILITOT 1.2  PROT 7.8  ALBUMIN 3.8    Coagulation Profile: Recent Labs  Lab 09/23/22 1429  INR 1.1   Radiological Exams on Admission: CT Hip Right Wo Contrast  Result Date: 09/23/2022 CLINICAL DATA:  Patient fell today.  Right hip pain EXAM: CT OF THE RIGHT HIP WITHOUT CONTRAST TECHNIQUE: Multidetector CT imaging of the right hip was performed according to the standard protocol. Multiplanar CT image reconstructions were also generated. RADIATION DOSE REDUCTION: This exam was performed according to the departmental dose-optimization program which includes automated exposure control, adjustment of the mA and/or kV according to patient size and/or use of iterative reconstruction technique. COMPARISON:  Radiograph performed earlier on the same date FINDINGS: Bones/Joint/Cartilage Subcapital impaction fracture of the right femoral neck with superolateral displacement of the distal femur. No joint effusion or intra-articular extension. Mild hip osteoarthritis. Sacroiliac joint and pubic symphysis are intact. Pubic bones are intact. Ligaments Suboptimally assessed by CT. Muscles and Tendons No intramuscular hematoma or fluid collection. Iliopsoas and hamstring tendons appear intact. Soft tissues Skin and subcutaneous soft tissues are within normal limits. IMPRESSION: Subcapital impaction fracture of the right femoral neck with superolateral displacement of the distal femur. Electronically Signed   By: Larose Hires D.O.   On: 09/23/2022 16:08   DG Hip Unilat W or Wo Pelvis 2-3 Views Right  Result Date: 09/23/2022 CLINICAL DATA:  Post fall, now with right hip pain. EXAM: DG HIP (WITH OR WITHOUT PELVIS) 2-3V RIGHT COMPARISON:  None Available. FINDINGS: There is a acute, impacted, foreshortened subcapsular right femoral neck fracture without definitive evidence of intra-articular extension. Mild degenerative change of the right hip is suspected with  joint space loss, subchondral sclerosis and osteophytosis. No evidence of avascular necrosis Limited visualization of the pelvis is normal. Degenerative change of the contralateral left hip is suspected though incompletely evaluated. Degenerative change of the lower lumbar spine is suspected though incompletely evaluated Phleboliths overlie the right hemipelvis. Regional soft tissues appear otherwise normal. IMPRESSION: Acute, impacted, foreshortened subcapsular right femoral neck fracture without definitive evidence of intra-articular extension. Electronically Signed   By: Simonne Come M.D.   On: 09/23/2022 15:15   DG Knee Complete 4 Views Right  Result Date: 09/23/2022 CLINICAL DATA:  Post fall, now with right knee pain. EXAM: RIGHT KNEE - COMPLETE 4+ VIEW COMPARISON:  None Available. FINDINGS: Sequela of previous pin, cerclage wire and lag screw fixation of the patella. The superior aspect of the cerclage wire is fractured though this is favored to be chronic in etiology given associated lucency surrounding the fracture component of the cerclage wire. No definite acute fracture or dislocation. No knee joint effusion. Mild-to-moderate tricompartmental degenerative  change of the knee is suspected though suboptimally evaluated due to obliquity. No evidence of chondrocalcinosis. Regional soft tissues appear normal. IMPRESSION: 1. No acute findings. 2. Sequela of previous pin, cerclage wire and lag screw fixation of the patella. Electronically Signed   By: Simonne Come M.D.   On: 09/23/2022 15:13   CT Head Wo Contrast  Result Date: 09/23/2022 CLINICAL DATA:  Head trauma, moderate-severe EXAM: CT HEAD WITHOUT CONTRAST TECHNIQUE: Contiguous axial images were obtained from the base of the skull through the vertex without intravenous contrast. RADIATION DOSE REDUCTION: This exam was performed according to the departmental dose-optimization program which includes automated exposure control, adjustment of the mA  and/or kV according to patient size and/or use of iterative reconstruction technique. COMPARISON:  None Available. FINDINGS: Brain: No evidence of acute infarction, hemorrhage, hydrocephalus, extra-axial collection or mass lesion/mass effect. Vascular: No hyperdense vessel or unexpected calcification. Skull: Normal. Negative for fracture or focal lesion. Sinuses/Orbits: No middle ear or mastoid effusion. Paranasal sinuses are clear. Orbits are unremarkable. Other: None. IMPRESSION: No CT evidence of intracranial injury. Electronically Signed   By: Lorenza Cambridge M.D.   On: 09/23/2022 14:30    EKG: Independently reviewed.  Ventricular bigeminy, sinus rhythm rate 75, QTc 492.  Incomplete LBBB.  Assessment/Plan Principal Problem:   Closed subcapital fracture of neck of right femur [facility]) Active Problems:   Fall at home, initial encounter   Alzheimer's dementia (HCC)   Assessment and Plan: * Closed subcapital fracture of neck of right femur (HCC) Status post mechanical fall.  CT right hip without contrast- Subcapital impaction fracture of the right femoral neck with superolateral displacement of the distal femur.At baseline ambulates without assistance or assistive devices. - EDP consulted Dr. Romeo Apple- will need right hip partial hip replacement bipolar replacement - NPO midnight - IV morphine 2mg  Q4hr prn - N/s 75cc/hr x 20hrs -Daughter is unaware of any cardiac history, patient denies cardiac history at this time, but has dementia.  Cardiology notes 2021-history of cardiac stents in [place]. -EKG today with ventricular bigeminy, ILBBB, will consult cardiology for clearance    Fall at home, initial encounter Mechanical fall at home sustaining right femur fracture. Head CT negative for acute abnormality.  Right knee x-ray unremarkable.  Patient lives with his daughter. - CK -Check UA, daughter reports frequency   DVT prophylaxis:  Heparin X 1, SCDS Code Status: FULL code-confirmed with patient,  and daughter at bedside. Family Communication: Daughter Debbie at bedside Disposition Plan: > 2 days Consults called: Ortho Admission status: Inpt Tele  I certify that at the point of admission it is my clinical judgment that the patient will require inpatient hospital care spanning beyond 2 midnights from the point of admission due to high intensity of service, high risk for further deterioration and high frequency of surveillance required.   Author: Onnie Boer, MD 09/23/2022 6:46 PM  For on call review [email or web address].

## 2022-09-23 NOTE — ED Notes (Signed)
Pt returned from ct

## 2022-09-23 NOTE — Progress Notes (Signed)
Patient ID: Frank West, male   DOB: 01-31-1936, 87 y.o.   MRN: [id number]   87 year old male apparently has a cardiac stent fractured his right hip  He will need a right hip partial hip replacement bipolar replacement     Latest Ref Rng & Units 09/23/2022    2:29 PM 07/17/2019    4:33 PM  CBC  WBC 4.0 - 10.5 K/uL 14.7  7.8   Hemoglobin 13.0 - 17.0 g/dL 82.9  56.2   Hematocrit 39.0 - 52.0 % 50.4  52.3   Platelets 150 - 400 K/uL 165  200       Latest Ref Rng & Units 09/23/2022    2:29 PM 07/17/2019    4:33 PM  BMP  Glucose 70 - 99 mg/dL 130  92   BUN 8 - 23 mg/dL 15  19   Creatinine 8.65 - 1.24 mg/dL 7.84  6.96   Sodium 295 - 145 mmol/L 136  139   Potassium 3.5 - 5.1 mmol/L 4.1  4.4   Chloride 98 - 111 mmol/L 103  103   CO2 22 - 32 mmol/L 22  26   Calcium 8.9 - 10.3 mg/dL 8.9  9.1    Labs look good  N.p.o. after midnight

## 2022-09-23 NOTE — Consult Note (Signed)
HOSPITAL CONSULT  [facility]   Patient ID: Frank West, male   DOB: 1935-03-14, 87 y.o.   MRN: [id number]  New patient  Requested by:Dr Willeen Niece  Reason for: Fractured right hip  Based on the information below I recommend right bipolar hip replacement  Chief Complaint  Patient presents with   Fall     HPI  87 year old male fell at home and fractured his right hip.  He lives with his daughter.  He complains of severe pain in his right hip which started on September 24, 2018 felt the quality is dull but sharp when he moves and the severity is a 10.  It is preventing him from walking He does not use any assistive devices at home Review of Systems (all) Review of Systems  Constitutional:  Negative for fever.  Respiratory:  Negative for shortness of breath.   Cardiovascular:  Negative for chest pain.  Skin: Negative.   Neurological:  Negative for tingling and sensory change.    Past Medical History:  Diagnosis Date   Alzheimer disease [facility])     Past Surgical History:  Procedure Laterality Date   CORONARY ANGIOPLASTY WITH STENT PLACEMENT     IR TRANSCATHETER BX  2007   done in [place]    uroscopy  2011    Family History  Problem Relation Age of Onset   Melanoma Father    Obesity Brother    Social History   Tobacco Use   Smoking status: Former   Smokeless tobacco: Never  Substance Use Topics   Alcohol use: Never   Drug use: Never   No Known Allergies No current facility-administered medications for this encounter.  Current Outpatient Medications:    ibuprofen (ADVIL) 200 MG tablet, Take 600 mg by mouth every 6 (six) hours as needed for moderate pain., Disp: , Rfl:     Physical Exam(=30) BP (!) 148/89   Pulse (!) 54   Temp 97.6 F (36.4 C) (Oral)   Resp 16   SpO2 95%   Gen. Appearance he appears to be well-developed and well-nourished grooming and hygiene are normal Peripheral vascular system no peripheral edema normal pulses Lymph nodes  ARE NORMAL  Gait currently unable to walk because of the fracture  Right lower extremity Inspection reveals shortening of the right lower extremity with some external rotation, tenderness over the right hip especially the greater trochanter and proximal thigh Range of motion is deferred because of the fracture Stability tests were also deferred because of the fracture Muscle strength and tone were normal. Skin was warm dry intact normal no erythema in the lower extremities  Inspection revealed no malalignment or asymmetry  Assessment of range of motion: Full range of motion was recorded  Assessment of stability: Elbow wrist and hand and shoulder were stable  Assessment of muscle strength and tone revealed grade 5 muscle strength and normal muscle tone  Skin was normal without rash lesion or ulceration  Left upper extremity  Inspection revealed no malalignment or asymmetry  Assessment of range of motion: Full range of motion was recorded  Assessment of stability: Ankle, knee and hip were stable  Assessment of muscle strength and tone revealed grade 5 muscle strength and normal muscle tone  Skin was normal without rash lesion or ulceration  Left lower extremity Inspection revealed no malalignment or asymmetry Assessment of range of motion: Full range of motion was recorded Assessment of stability: Ankle, knee and hip were stable Assessment of muscle strength and tone  revealed grade 5 muscle strength and normal muscle tone Skin was normal without rash lesion or ulceration  Coordination was tested by finger-to-nose nose and was normal Deep tendon reflexes were 2+ in the upper extremities and lower extremity deferred examination of sensation by touch was normal  Mental status  Oriented to time person and place normal  Mood and affect normal without depression anxiety or agitation  Dx:   Data Reviewed  ER RECORD REVIEWED: CONFIRMS HISTORY   I reviewed the following images and the  reports and my independent interpretation is right femoral neck fracture transcervical displaced     Latest Ref Rng & Units 09/24/2022    3:58 AM 09/23/2022    2:29 PM 07/17/2019    4:33 PM  CBC  WBC 4.0 - 10.5 K/uL 12.0  14.7  7.8   Hemoglobin 13.0 - 17.0 g/dL 16.1  09.6  04.5   Hematocrit 39.0 - 52.0 % 49.4  50.4  52.3   Platelets 150 - 400 K/uL 139  165  200       Latest Ref Rng & Units 09/24/2022    3:58 AM 09/23/2022    2:29 PM 07/17/2019    4:33 PM  BMP  Glucose 70 - 99 mg/dL 409  811  92   BUN 8 - 23 mg/dL 18  15  19    Creatinine 0.61 - 1.24 mg/dL 9.14  7.82  9.56   Sodium 135 - 145 mmol/L 136  136  139   Potassium 3.5 - 5.1 mmol/L 4.3  4.1  4.4   Chloride 98 - 111 mmol/L 103  103  103   CO2 22 - 32 mmol/L 22  22  26    Calcium 8.9 - 10.3 mg/dL 8.9  8.9  9.1       Assessment  After image review I see that the patient has a right femoral neck fracture  He will require bipolar hip replacement Plan  The procedure has been fully reviewed with the patient; The risks and benefits of surgery have been discussed and explained and understood. Alternative treatment has also been reviewed, questions were encouraged and answered. The postoperative plan is also been reviewed.  Surgical fixation of the right hip with a bipolar hip replacement     Vickki Hearing MD

## 2022-09-24 ENCOUNTER — Encounter (HOSPITAL_COMMUNITY): Payer: Self-pay | Admitting: Anesthesiology

## 2022-09-24 ENCOUNTER — Encounter (HOSPITAL_COMMUNITY)
Admission: EM | Disposition: A | Discharge status: Skilled Nursing Facility | Payer: Self-pay | Source: Home / Self Care | Attending: Family Medicine

## 2022-09-24 ENCOUNTER — Other Ambulatory Visit (HOSPITAL_COMMUNITY): Payer: Self-pay | Admitting: *Deleted

## 2022-09-24 ENCOUNTER — Encounter (HOSPITAL_COMMUNITY): Payer: Self-pay | Admitting: Internal Medicine

## 2022-09-24 ENCOUNTER — Inpatient Hospital Stay (HOSPITAL_COMMUNITY): Payer: Medicare Other

## 2022-09-24 DIAGNOSIS — I493 Ventricular premature depolarization: Secondary | ICD-10-CM

## 2022-09-24 DIAGNOSIS — F0390 Unspecified dementia without behavioral disturbance: Secondary | ICD-10-CM | POA: Diagnosis not present

## 2022-09-24 DIAGNOSIS — I2510 Atherosclerotic heart disease of native coronary artery without angina pectoris: Secondary | ICD-10-CM | POA: Diagnosis not present

## 2022-09-24 DIAGNOSIS — Z01810 Encounter for preprocedural cardiovascular examination: Secondary | ICD-10-CM | POA: Diagnosis not present

## 2022-09-24 DIAGNOSIS — S72011A Unspecified intracapsular fracture of right femur, initial encounter for closed fracture: Secondary | ICD-10-CM | POA: Diagnosis not present

## 2022-09-24 DIAGNOSIS — R079 Chest pain, unspecified: Secondary | ICD-10-CM

## 2022-09-24 LAB — ECHOCARDIOGRAM COMPLETE
AR max vel: 1.35 cm2
AV Area VTI: 1.41 cm2
AV Area mean vel: 1.6 cm2
AV Mean grad: 5 mmHg
AV Peak grad: 11.3 mmHg
Ao pk vel: 1.68 m/s
Area-P 1/2: 2.09 cm2
Height: 71.496 in
S' Lateral: 2.4 cm
Weight: 2857.16 oz

## 2022-09-24 LAB — MAGNESIUM: Magnesium: 2.1 mg/dL (ref 1.7–2.4)

## 2022-09-24 LAB — TSH: TSH: 2.565 u[IU]/mL (ref 0.350–4.500)

## 2022-09-24 SURGERY — ARTHROPLASTY BIPOLAR HIP (HEMIARTHROPLASTY)
Anesthesia: Choice | Site: Hip | Laterality: Right

## 2022-09-24 MED ORDER — MORPHINE SULFATE (PF) 2 MG/ML IV SOLN
2.0000 mg | INTRAVENOUS | Status: DC | PRN
  Administered 2022-09-24 – 2022-09-26 (×10): 2 mg via INTRAVENOUS
  Filled 2022-09-24 (×11): qty 1

## 2022-09-24 MED ORDER — PERFLUTREN LIPID MICROSPHERE
1.0000 mL | INTRAVENOUS | Status: AC | PRN
  Administered 2022-09-24: 5 mL via INTRAVENOUS

## 2022-09-24 MED ORDER — HYDROXYZINE HCL 10 MG PO TABS
10.0000 mg | ORAL_TABLET | Freq: Once | ORAL | Status: AC | PRN
  Administered 2022-09-24: 10 mg via ORAL
  Filled 2022-09-24: qty 1

## 2022-09-24 MED ORDER — KETOROLAC TROMETHAMINE 15 MG/ML IJ SOLN
15.0000 mg | Freq: Once | INTRAMUSCULAR | Status: AC
  Administered 2022-09-24: 15 mg via INTRAVENOUS
  Filled 2022-09-24: qty 1

## 2022-09-24 NOTE — TOC Initial Note (Signed)
Transition of Care [facility]) - Initial/Assessment Note    Patient Details  Name: Frank West MRN: [id number] Date of Birth: 1935/11/19  Transition of Care [facility]) CM/SW Contact:    Karn Cassis, LCSW Phone Number: 09/24/2022, 11:01 AM  Clinical Narrative: Pt admitted due to hip fracture. Assessment completed with pt's daughter, [name]. Pt lives with [name] and is independent with ADLs at baseline. No home health prior to admission. Pt awaiting cardiology clearance for surgery. Discussed pt will likely need SNF at d/c and [facility] agreeable. Reviewed Medicare.gov ratings. Will initiate bed search.               Expected Discharge Plan: Skilled Nursing Facility Barriers to Discharge: Continued Medical Work up   Patient Goals and CMS Choice     Choice offered to / list presented to : Adult Children Welcome ownership interest in [facility].provided to:: Adult Children    Expected Discharge Plan and Services In-house Referral: Clinical Social Work   Post Acute Care Choice: Skilled Nursing Facility Living arrangements for the past 2 months: Single Family Home                                      Prior Living Arrangements/Services Living arrangements for the past 2 months: Single Family Home Lives with:: Adult Children Patient language and need for interpreter reviewed:: Yes Do you feel safe going back to the place where you live?: Yes            Criminal Activity/Legal Involvement Pertinent to Current Situation/Hospitalization: No - Comment as needed  Activities of Daily Living Home Assistive Devices/Equipment: None ADL Screening (condition at time of admission) Patient's cognitive ability adequate to safely complete daily activities?: No Is the patient deaf or have difficulty hearing?: Yes Does the patient have difficulty seeing, even when wearing glasses/contacts?: No Does the patient have difficulty concentrating, remembering, or making  decisions?: Yes Patient able to express need for assistance with ADLs?: Yes Does the patient have difficulty dressing or bathing?: No Independently performs ADLs?: No Communication: Independent Dressing (OT): Needs assistance Is this a change from baseline?: Pre-admission baseline Grooming: Needs assistance Is this a change from baseline?: Pre-admission baseline Feeding: Independent Bathing: Independent Toileting: Independent In/Out Bed: Independent Walks in Home: Independent with device (comment) Does the patient have difficulty walking or climbing stairs?: Yes Weakness of Legs: Both Weakness of Arms/Hands: None  Permission Sought/Granted                  Emotional Assessment         Alcohol / Substance Use: Not Applicable Psych Involvement: No (comment)  Admission diagnosis:  Closed subcapital fracture of neck of right femur (HCC) [S72.011A] Patient Active Problem List   Diagnosis Date Noted   Closed subcapital fracture of neck of right femur (HCC) 09/23/2022   Alzheimer's dementia (HCC) 09/23/2022   Fall at home, initial encounter 09/23/2022   PCP:  Patient, No Pcp Per Pharmacy:   [facility] [address] [place] - [address] [place] [address] Phone: [phone] Fax: [phone]     Social Determinants of Health (SDOH) Social History: SDOH Screenings   Food Insecurity: No Food Insecurity (09/23/2022)  Housing: Low Risk  (09/23/2022)  Transportation Needs: No Transportation Needs (09/23/2022)  Utilities: Not At Risk (09/23/2022)  Tobacco Use: Medium Risk (09/23/2022)   SDOH Interventions:  Readmission Risk Interventions     No data to display

## 2022-09-24 NOTE — Plan of Care (Signed)

## 2022-09-24 NOTE — Consult Note (Addendum)
Cardiology Consultation   Patient ID: Frank West MRN: [id number]; DOB: [birth year]  Admit date: 09/23/2022 Date of Consult: 09/24/2022  PCP:  Patient, No Pcp Per   [facility] Providers Cardiologist:  Frank Rich, MD        Patient Profile:   Frank West is a 87 y.o. male with a hx of CAD (s/p prior stent in 2017 but details unavailable) and dementia who is being seen 09/24/2022 for the evaluation of preoperative cardiac clearance at the request of Dr. Idelle Leech.  History of Present Illness:   Frank West presented to [facility] ED on 09/23/2022 after suffering a mechanical fall and reporting right hip and knee pain. Initial labs showed WBC 14.7, Hgb 16.6, platelets 165, Na+ 136, K+ 4.1 and creatinine 1.21. AST 63 and ALT 59. CK 86. CT Head showed no acute intracranial abnormality. CT Hip showed subcapital fracture of the right femoral neck with superolateral displacement of the distal femur. Orthopedics was consulted and recommended bipolar hip replacement.   EKG was obtained while in the ED and showed NSR, HR 75 with anterior infarct pattern and PVC's in a pattern of bigeminy at times. Repeat for this morning is pending. Given his abnormal EKG and the need for surgical clearance, a cardiology consult was requested.   In talking with the patient and his daughter today, she reports that he lives with her but is still able to perform ADL's independently. He also still drives to town and goes to the store. He is unable to recall events within the past month but his daughter notes that he has been complaining of more shortness of breath and episodes of chest pain for 3 to 4 weeks. She says that he complains about this when walking the dog or walking outside to and from his car. He typically rests and symptoms resolve. He has not tried taking anything like SL NTG or Tums. He is not on any medications at home and when asked about this he reports "I am not going to take anything".  His daughter says he has refused medications for several years now and refuses to go see a PCP. He denies any specific orthopnea, PND or pitting edema. No recent palpitations. He does report episodes of dizziness but no syncope.   Past Medical History:  Diagnosis Date   Alzheimer disease (HCC)    CAD (coronary artery disease)    a. s/p prior stent in 2017 but details unavailable    Past Surgical History:  Procedure Laterality Date   CORONARY ANGIOPLASTY WITH STENT PLACEMENT     IR TRANSCATHETER BX  2007   done in [place]    uroscopy  2011     Home Medications:  Prior to Admission medications   Medication Sig Start Date End Date Taking? Authorizing Provider  ibuprofen (ADVIL) 200 MG tablet Take 600 mg by mouth every 6 (six) hours as needed for moderate pain.   Yes [provider]    Inpatient Medications: Scheduled Meds:  Continuous Infusions:  sodium chloride 75 mL/hr at 09/23/22 2008   PRN Meds: acetaminophen **OR** acetaminophen, morphine (PF), polyethylene glycol  Allergies:   No Known Allergies  Social History:   Social History   Socioeconomic History   Marital status: Widowed    Spouse name: Not on file   Number of children: Not on file   Years of education: Not on file   Highest education level: Not on file  Occupational History   Not on  file  Tobacco Use   Smoking status: Former   Smokeless tobacco: Never  Substance and Sexual Activity   Alcohol use: Never   Drug use: Never   Sexual activity: Not on file  Other Topics Concern   Not on file  Social History Narrative   Not on file   Social Determinants of Health   Financial Resource Strain: Not on file  Food Insecurity: No Food Insecurity (09/23/2022)   Hunger Vital Sign    Worried About Running Out of Food in the Last Year: Never true    Ran Out of Food in the Last Year: Never true  Transportation Needs: No Transportation Needs (09/23/2022)   PRAPARE - Scientist, research (physical sciences) (Medical): No    Lack of Transportation (Non-Medical): No  Physical Activity: Not on file  Stress: Not on file  Social Connections: Not on file  Intimate Partner Violence: Not At Risk (09/23/2022)   Humiliation, Afraid, Rape, and Kick questionnaire    Fear of Current or Ex-Partner: No    Emotionally Abused: No    Physically Abused: No    Sexually Abused: No    Family History:    Family History  Problem Relation Age of Onset   Melanoma Father    Obesity Brother      ROS:  Please see the history of present illness.   All other ROS reviewed and negative.     Physical Exam/Data:   Vitals:   09/23/22 1823 09/23/22 1912 09/23/22 1919 09/24/22 0324  BP: (!) 156/102 (!) 173/90  (!) 114/90  Pulse:  84  82  Resp: (!) 25 18  18   Temp:  98.1 F (36.7 C)  98.9 F (37.2 C)  TempSrc:  Oral    SpO2:  99% 99% 95%  Weight:  81 kg    Height:  5' 11.5" (1.816 m)      Intake/Output Summary (Last 24 hours) at 09/24/2022 0947 Last data filed at 09/24/2022 1610 Gross per 24 hour  Intake 571.25 ml  Output 300 ml  Net 271.25 ml      09/23/2022    7:12 PM 08/06/2019    3:00 PM 07/17/2019    3:51 PM  Last 3 Weights  Weight (lbs) 178 lb 9.2 oz 199 lb 3.2 oz 180 lb  Weight (kg) 81 kg 90.357 kg 81.647 kg     Body mass index is 24.56 kg/m.  General: Pleasant, elderly male appearing in no acute distress HEENT: normal Neck: no JVD Vascular: No carotid bruits; Distal pulses 2+ bilaterally Cardiac:  normal S1, S2; RRR with frequent ectopic beats; 2/6 systolic murmur along RUSB.  Lungs:  clear to auscultation bilaterally, no wheezing, rhonchi or rales  Abd: soft, nontender, no hepatomegaly  Ext: no pitting edema Musculoskeletal:  No deformities, BUE and BLE strength normal and equal Skin: warm and dry  Neuro:  CNs 2-12 intact, no focal abnormalities noted Psych: A&Ox3.   EKG:  The EKG was personally reviewed and demonstrates: NSR, HR 75 with anterior infarct pattern and  PVC's in a pattern of bigeminy at times  Telemetry:  Telemetry was personally reviewed and demonstrates: NSR, HR in 70's to 80's with frequent PVC's in a pattern of trigeminy and couplets.   Relevant CV Studies:  None on File.   Laboratory Data:  High Sensitivity Troponin:  No results for input(s): "TROPONINIHS" in the last 720 hours.   Chemistry Recent Labs  Lab 09/23/22 1429 09/24/22 0358  NA 136  136  K 4.1 4.3  CL 103 103  CO2 22 22  GLUCOSE 125* 156*  BUN 15 18  CREATININE 1.21 1.14  CALCIUM 8.9 8.9  GFRNONAA 59* >60  ANIONGAP 11 11    Recent Labs  Lab 09/23/22 1429  PROT 7.8  ALBUMIN 3.8  AST 63*  ALT 59*  ALKPHOS 71  BILITOT 1.2   Lipids No results for input(s): "CHOL", "TRIG", "HDL", "LABVLDL", "LDLCALC", "CHOLHDL" in the last 168 hours.  Hematology Recent Labs  Lab 09/23/22 1429 09/24/22 0358  WBC 14.7* 12.0*  RBC 5.42 5.32  HGB 16.6 16.2  HCT 50.4 49.4  MCV 93.0 92.9  MCH 30.6 30.5  MCHC 32.9 32.8  RDW 12.7 12.6  PLT 165 139*   Thyroid No results for input(s): "TSH", "FREET4" in the last 168 hours.  BNPNo results for input(s): "BNP", "PROBNP" in the last 168 hours.  DDimer No results for input(s): "DDIMER" in the last 168 hours.   Radiology/Studies:  CT Hip Right Wo Contrast  Result Date: 09/23/2022 CLINICAL DATA:  Patient fell today.  Right hip pain EXAM: CT OF THE RIGHT HIP WITHOUT CONTRAST TECHNIQUE: Multidetector CT imaging of the right hip was performed according to the standard protocol. Multiplanar CT image reconstructions were also generated. RADIATION DOSE REDUCTION: This exam was performed according to the departmental dose-optimization program which includes automated exposure control, adjustment of the mA and/or kV according to patient size and/or use of iterative reconstruction technique. COMPARISON:  Radiograph performed earlier on the same date FINDINGS: Bones/Joint/Cartilage Subcapital impaction fracture of the right femoral neck  with superolateral displacement of the distal femur. No joint effusion or intra-articular extension. Mild hip osteoarthritis. Sacroiliac joint and pubic symphysis are intact. Pubic bones are intact. Ligaments Suboptimally assessed by CT. Muscles and Tendons No intramuscular hematoma or fluid collection. Iliopsoas and hamstring tendons appear intact. Soft tissues Skin and subcutaneous soft tissues are within normal limits. IMPRESSION: Subcapital impaction fracture of the right femoral neck with superolateral displacement of the distal femur. Electronically Signed   By: Larose Hires D.O.   On: 09/23/2022 16:08   DG Hip Unilat W or Wo Pelvis 2-3 Views Right  Result Date: 09/23/2022 CLINICAL DATA:  Post fall, now with right hip pain. EXAM: DG HIP (WITH OR WITHOUT PELVIS) 2-3V RIGHT COMPARISON:  None Available. FINDINGS: There is a acute, impacted, foreshortened subcapsular right femoral neck fracture without definitive evidence of intra-articular extension. Mild degenerative change of the right hip is suspected with joint space loss, subchondral sclerosis and osteophytosis. No evidence of avascular necrosis Limited visualization of the pelvis is normal. Degenerative change of the contralateral left hip is suspected though incompletely evaluated. Degenerative change of the lower lumbar spine is suspected though incompletely evaluated Phleboliths overlie the right hemipelvis. Regional soft tissues appear otherwise normal. IMPRESSION: Acute, impacted, foreshortened subcapsular right femoral neck fracture without definitive evidence of intra-articular extension. Electronically Signed   By: Simonne Come M.D.   On: 09/23/2022 15:15   DG Knee Complete 4 Views Right  Result Date: 09/23/2022 CLINICAL DATA:  Post fall, now with right knee pain. EXAM: RIGHT KNEE - COMPLETE 4+ VIEW COMPARISON:  None Available. FINDINGS: Sequela of previous pin, cerclage wire and lag screw fixation of the patella. The superior aspect of the  cerclage wire is fractured though this is favored to be chronic in etiology given associated lucency surrounding the fracture component of the cerclage wire. No definite acute fracture or dislocation. No knee joint effusion. Mild-to-moderate tricompartmental  degenerative change of the knee is suspected though suboptimally evaluated due to obliquity. No evidence of chondrocalcinosis. Regional soft tissues appear normal. IMPRESSION: 1. No acute findings. 2. Sequela of previous pin, cerclage wire and lag screw fixation of the patella. Electronically Signed   By: Simonne Come M.D.   On: 09/23/2022 15:13   CT Head Wo Contrast  Result Date: 09/23/2022 CLINICAL DATA:  Head trauma, moderate-severe EXAM: CT HEAD WITHOUT CONTRAST TECHNIQUE: Contiguous axial images were obtained from the base of the skull through the vertex without intravenous contrast. RADIATION DOSE REDUCTION: This exam was performed according to the departmental dose-optimization program which includes automated exposure control, adjustment of the mA and/or kV according to patient size and/or use of iterative reconstruction technique. COMPARISON:  None Available. FINDINGS: Brain: No evidence of acute infarction, hemorrhage, hydrocephalus, extra-axial collection or mass lesion/mass effect. Vascular: No hyperdense vessel or unexpected calcification. Skull: Normal. Negative for fracture or focal lesion. Sinuses/Orbits: No middle ear or mastoid effusion. Paranasal sinuses are clear. Orbits are unremarkable. Other: None. IMPRESSION: No CT evidence of intracranial injury. Electronically Signed   By: Lorenza Cambridge M.D.   On: 09/23/2022 14:30     Assessment and Plan:   1. Preoperative Cardiac Clearance for Hip Surgery - He suffered a mechanical fall as described above and now requires hip surgery but in talking with the patient and his daughter today, she reports he has been experiencing episodes of chest pain with exertion and also reporting worsening  dyspnea on exertion over the past 3 to 4 weeks. Symptoms resolve with rest and he has refused to take any medications to help with this. Description of symptoms is concerning for angina and he is having frequent ectopy as noted on his EKG and monitor.   - Will obtain a repeat twelve-lead EKG. I have spoken to our echocardiogram technician to see if an echo can be obtained this morning as he is scheduled for surgery later today. If he is found to have significant structural abnormalities, he may benefit from having surgery at [facility].   2. CAD - He is s/p prior stent in 2017 but details unavailable. Does report symptoms concerning for angina as described above. Would plan for a repeat echo. If open to medical therapy, would recommend restarting ASA and statin therapy. High-threshold for cath given medical noncompliance and dementia.   3. PVC's - He was having bigeminy on admission and now having trigeminy and occasional couplets. K+ 4.3 this AM. Will check Mg and TSH. Would likely benefit from a low-dose beta-blocker if open to medical therapy.   4. Dementia - He is A&Ox3 at the time of this encounter. His daughter is at the bedside and contributes to the history.    For questions or updates, please contact [facility] Please consult [email or web address] for contact info under    Signed, Frank Lennox, Frank West  09/24/2022 9:47 AM  Attending note Patient seen and discussed with PA Frank West, I agree with her documentation. 87 yo male history of dementia, reports prior cardiac stent in 2017 in [place] limited details known, presents after mechanical fall at home with subsequent hip fracture. Cardiology consulted for preoperative evaluation. Evaluatoin is complicated by patient's dementia, from notes patient denies any recent chest pains or SOB/DOE however family endorses some recent symptoms    WBC 14.7 Plt 165 K 4.1 BUN 14 Cr 1.21  CT head no acute process EKG SR, LAFB, PVCs, anteroseptal  Qwaves, no acute ischemic findings  1.Preoperative  evaluation - patient presents with mechanical fall and hip fracture requiring surgery -  He has a history of CAD with stent in 2017 - there is no evidence of acute cardiac conditions.  - difficult evaluation given his dementia. He denies any recent chest pains or SOB however family does state he has indicated to them some symptoms at home though this history also varies. On PA Strader's evaluation they reported DOE and chest pains, on my evaluation just DOE. He will have his hand on his chest as if it hurts but has not verbalized specifically chest pain. DOE typically comes on with 5-10 minut walks with the dog or walking at the store.   - EKG SR with PVCs, anteroseptal Qwaves, no acute ischemic changes - echo LVE 60-65%, no WMAs, no significant valvular pathology  -NSQIP risk would predict any serious complication at 9.5%, specifically 1.3% risk of cardiac complications. This calculation is somewhat limited by limitations in history of symptoms as reported above.    - relatively higher risk but not prohibitive risk given CAD history and some reported symptoms.  - there is no significant timely cardiac testing or intervention that would not cause significant delay in surgery of at least 3 months if stenting were required - would plan to proceed with higher risk surgery, would recommend being done at [facility] so access to higher levels of care particularly cardiology if needed. There is no cardiology team on site at [facility] over the weekend   2.PVCs - could consider beta blocker in the postop period, would not initiate brand new in preop setting.    We will sign off inpatient care, if needed in postop period would need to contact cards consult service.   Frank Rich MD

## 2022-09-24 NOTE — Progress Notes (Signed)
PROGRESS NOTE    Frank West  ZOX:[id number] DOB: [birth year] DOA: 09/23/2022 PCP: Patient, No Pcp Per   Brief Narrative:  This 87 years old male with PMH significant for Alzheimer's dementia presented to the ED s/p mechanical fall at home.  Patient was found on the floor by her daughter by around 9 AM in the morning.  Patient reports he tripped and fall, he did not lose consciousness.  He was not able to get up.  He reports vomiting twice in the morning because of severe pain.  At baseline patient ambulates without assistance.  As per daughter she does not know the details about his cardiac history but per cardiology notes he has a stent placed in [place].  Workup in the ED reveals subcapital impacted fracture of the right femoral neck.  CT head negative for acute abnormality.  ED physician spoke with orthopedics Dr. Romeo Apple,  Who has scheduled patient for ORIF today pending cardiology clearance.  Cardiology is consulted,  echo is pending.  Assessment & Plan:   Principal Problem:   Closed subcapital fracture of neck of right femur [facility]) Active Problems:   Fall at home, initial encounter   Alzheimer's dementia (HCC)  Closed subcapital fracture of right femur neck: Patient presented in the ED s/p mechanical fall. CT right hip showed subcapital impaction fracture of the right femoral neck.   At baseline patient ambulates without assistance. ED physician consulted orthopedics Dr. Romeo Apple, recommended ORIF. Patient was kept  NPO for OR in the morning. Continue IV morphine as needed for pain control. Continue IV hydration, gentle. Daughter is unaware about cardiac history.  Per cardiology note shows history of stents in [place]. EKG showed ventricular bigeminy, LBBB Cardiology is consulted recommended echocardiogram. If echo showed structural abnormalities then patient would be transferred to [facility] for ORIF.  Preop clearance for hip surgery; Cardiology is consulted. Daughter reports  patient has been having episodes of chest pain with exertion over past 3 to 4 weeks.  Symptoms resolved with rest, symptoms are concerning for angina,  also having frequent ectopy on his EKG. If echocardiogram showed  structural abnormalities,  he would benefit from having surgery at [facility]  Coronary artery disease: Patient has history of stent in 2017 but details are unavailable. Cardiology is consulted recommended echocardiogram.  Alzheimer's dementia: Mental status at baseline. Denies any behavioral problems.  Fall at home: CT head negative for acute abnormality. Right knee x-ray unremarkable. PT and OT evaluation.  DVT prophylaxis: Heparin SQ Code Status: Full code Family Communication: Daughter at bed side. Disposition Plan:   Status is: Inpatient Remains inpatient appropriate because: Admitted s/p mechanical fall with right femur fracture.  Patient will require ORIF pending cardiology clearance.    Consultants:  Orthopedics Cardiology  Procedures: Echo, Scheduled ORIF  Antimicrobials:  Anti-infectives (From admission, onward)    None       Subjective: Patient was seen and examined at bedside.  Overnight events noted.  Patient denies any pain and states pain happens when he moves. Patient is scheduled to have ORIF today,  pending cardiology clearance.  Objective: Vitals:   09/23/22 1823 09/23/22 1912 09/23/22 1919 09/24/22 0324  BP: (!) 156/102 (!) 173/90  (!) 114/90  Pulse:  84  82  Resp: (!) 25 18  18   Temp:  98.1 F (36.7 C)  98.9 F (37.2 C)  TempSrc:  Oral    SpO2:  99% 99% 95%  Weight:  81 kg    Height:  5'  11.5" (1.816 m)      Intake/Output Summary (Last 24 hours) at 09/24/2022 1043 Last data filed at 09/24/2022 0814 Gross per 24 hour  Intake 571.25 ml  Output 300 ml  Net 271.25 ml   Filed Weights   09/23/22 1912  Weight: 81 kg    Examination:  General exam: Appears calm and comfortable, not in any acute distress. Respiratory  system: Clear to auscultation. Respiratory effort normal.  RR 16 Cardiovascular system: S1 & S2 heard, RRR. No JVD, murmurs, rubs, gallops or clicks. No pedal edema. Gastrointestinal system: Abdomen is soft, non tender, non distended, bowel sounds present Central nervous system: Alert and oriented x 1. No focal neurological deficits. Extremities: Right hip tenderness with exam. Skin: No rashes, lesions or ulcers Psychiatry: Judgement and insight appear normal. Mood & affect appropriate.     Data Reviewed: I have personally reviewed following labs and imaging studies  CBC: Recent Labs  Lab 09/23/22 1429 09/24/22 0358  WBC 14.7* 12.0*  NEUTROABS 12.5*  --   HGB 16.6 16.2  HCT 50.4 49.4  MCV 93.0 92.9  PLT 165 139*   Basic Metabolic Panel: Recent Labs  Lab 09/23/22 1429 09/24/22 0358 09/24/22 0925  NA 136 136  --   K 4.1 4.3  --   CL 103 103  --   CO2 22 22  --   GLUCOSE 125* 156*  --   BUN 15 18  --   CREATININE 1.21 1.14  --   CALCIUM 8.9 8.9  --   MG  --   --  2.1   GFR: Estimated Creatinine Clearance: 51.2 mL/min (by C-G formula based on SCr of 1.14 mg/dL). Liver Function Tests: Recent Labs  Lab 09/23/22 1429  AST 63*  ALT 59*  ALKPHOS 71  BILITOT 1.2  PROT 7.8  ALBUMIN 3.8   No results for input(s): "LIPASE", "AMYLASE" in the last 168 hours. No results for input(s): "AMMONIA" in the last 168 hours. Coagulation Profile: Recent Labs  Lab 09/23/22 1429  INR 1.1   Cardiac Enzymes: Recent Labs  Lab 09/23/22 1429  CKTOTAL 86   BNP (last 3 results) No results for input(s): "PROBNP" in the last 8760 hours. HbA1C: No results for input(s): "HGBA1C" in the last 72 hours. CBG: No results for input(s): "GLUCAP" in the last 168 hours. Lipid Profile: No results for input(s): "CHOL", "HDL", "LDLCALC", "TRIG", "CHOLHDL", "LDLDIRECT" in the last 72 hours. Thyroid Function Tests: Recent Labs    09/24/22 0925  TSH 2.565   Anemia Panel: No results for  input(s): "VITAMINB12", "FOLATE", "FERRITIN", "TIBC", "IRON", "RETICCTPCT" in the last 72 hours. Sepsis Labs: No results for input(s): "PROCALCITON", "LATICACIDVEN" in the last 168 hours.  No results found for this or any previous visit (from the past 240 hour(s)).   Radiology Studies: CT Hip Right Wo Contrast  Result Date: 09/23/2022 CLINICAL DATA:  Patient fell today.  Right hip pain EXAM: CT OF THE RIGHT HIP WITHOUT CONTRAST TECHNIQUE: Multidetector CT imaging of the right hip was performed according to the standard protocol. Multiplanar CT image reconstructions were also generated. RADIATION DOSE REDUCTION: This exam was performed according to the departmental dose-optimization program which includes automated exposure control, adjustment of the mA and/or kV according to patient size and/or use of iterative reconstruction technique. COMPARISON:  Radiograph performed earlier on the same date FINDINGS: Bones/Joint/Cartilage Subcapital impaction fracture of the right femoral neck with superolateral displacement of the distal femur. No joint effusion or intra-articular extension. Mild hip  osteoarthritis. Sacroiliac joint and pubic symphysis are intact. Pubic bones are intact. Ligaments Suboptimally assessed by CT. Muscles and Tendons No intramuscular hematoma or fluid collection. Iliopsoas and hamstring tendons appear intact. Soft tissues Skin and subcutaneous soft tissues are within normal limits. IMPRESSION: Subcapital impaction fracture of the right femoral neck with superolateral displacement of the distal femur. Electronically Signed   By: Larose Hires D.O.   On: 09/23/2022 16:08   DG Hip Unilat W or Wo Pelvis 2-3 Views Right  Result Date: 09/23/2022 CLINICAL DATA:  Post fall, now with right hip pain. EXAM: DG HIP (WITH OR WITHOUT PELVIS) 2-3V RIGHT COMPARISON:  None Available. FINDINGS: There is a acute, impacted, foreshortened subcapsular right femoral neck fracture without definitive evidence of  intra-articular extension. Mild degenerative change of the right hip is suspected with joint space loss, subchondral sclerosis and osteophytosis. No evidence of avascular necrosis Limited visualization of the pelvis is normal. Degenerative change of the contralateral left hip is suspected though incompletely evaluated. Degenerative change of the lower lumbar spine is suspected though incompletely evaluated Phleboliths overlie the right hemipelvis. Regional soft tissues appear otherwise normal. IMPRESSION: Acute, impacted, foreshortened subcapsular right femoral neck fracture without definitive evidence of intra-articular extension. Electronically Signed   By: Simonne Come M.D.   On: 09/23/2022 15:15   DG Knee Complete 4 Views Right  Result Date: 09/23/2022 CLINICAL DATA:  Post fall, now with right knee pain. EXAM: RIGHT KNEE - COMPLETE 4+ VIEW COMPARISON:  None Available. FINDINGS: Sequela of previous pin, cerclage wire and lag screw fixation of the patella. The superior aspect of the cerclage wire is fractured though this is favored to be chronic in etiology given associated lucency surrounding the fracture component of the cerclage wire. No definite acute fracture or dislocation. No knee joint effusion. Mild-to-moderate tricompartmental degenerative change of the knee is suspected though suboptimally evaluated due to obliquity. No evidence of chondrocalcinosis. Regional soft tissues appear normal. IMPRESSION: 1. No acute findings. 2. Sequela of previous pin, cerclage wire and lag screw fixation of the patella. Electronically Signed   By: Simonne Come M.D.   On: 09/23/2022 15:13   CT Head Wo Contrast  Result Date: 09/23/2022 CLINICAL DATA:  Head trauma, moderate-severe EXAM: CT HEAD WITHOUT CONTRAST TECHNIQUE: Contiguous axial images were obtained from the base of the skull through the vertex without intravenous contrast. RADIATION DOSE REDUCTION: This exam was performed according to the departmental  dose-optimization program which includes automated exposure control, adjustment of the mA and/or kV according to patient size and/or use of iterative reconstruction technique. COMPARISON:  None Available. FINDINGS: Brain: No evidence of acute infarction, hemorrhage, hydrocephalus, extra-axial collection or mass lesion/mass effect. Vascular: No hyperdense vessel or unexpected calcification. Skull: Normal. Negative for fracture or focal lesion. Sinuses/Orbits: No middle ear or mastoid effusion. Paranasal sinuses are clear. Orbits are unremarkable. Other: None. IMPRESSION: No CT evidence of intracranial injury. Electronically Signed   By: Lorenza Cambridge M.D.   On: 09/23/2022 14:30    Scheduled Meds: Continuous Infusions:  sodium chloride 75 mL/hr at 09/23/22 2008     LOS: 1 day    Time spent: 50 mins    Willeen Niece, MD [facility]   If 7PM-7AM, please contact night-coverage

## 2022-09-24 NOTE — Progress Notes (Signed)
1014 Attempted to obtain EKG, patient having ECHO done. EKG to be obtained at a later time.

## 2022-09-24 NOTE — NC FL2 (Signed)
  [place] MEDICAID FL2 LEVEL OF CARE FORM     IDENTIFICATION  Patient Name: Frank West Birthdate: 11-May-1935 Sex: male Admission Date (Current Location): 09/23/2022  [facility] [place] [facility]:  [employer] and Address:  [facility],  [address], [place] [address]      Provider Number: [id number]  Attending Physician Name and Address:  Willeen Niece, MD  Relative Name and Phone Number:       Current Level of Care: Hospital Recommended Level of Care: Skilled Nursing Facility Prior Approval Number:    Date Approved/Denied:   PASRR Number: [id number]  Discharge Plan: SNF    Current Diagnoses: Patient Active Problem List   Diagnosis Date Noted   Closed subcapital fracture of neck of right femur (HCC) 09/23/2022   Alzheimer's dementia (HCC) 09/23/2022   Fall at home, initial encounter 09/23/2022    Orientation RESPIRATION BLADDER Height & Weight     Self, Place, Time  Normal Incontinent Weight: 178 lb 9.2 oz (81 kg) Height:  5' 11.5" (181.6 cm)  BEHAVIORAL SYMPTOMS/MOOD NEUROLOGICAL BOWEL NUTRITION STATUS      Incontinent Diet (NPO time specified. See d/c summary for updates.)  AMBULATORY STATUS COMMUNICATION OF NEEDS Skin   Extensive Assist Verbally Surgical wounds, Skin abrasions                       Personal Care Assistance Level of Assistance  Bathing, Feeding, Dressing Bathing Assistance: Maximum assistance Feeding assistance: Limited assistance Dressing Assistance: Maximum assistance     Functional Limitations Info  Sight, Hearing, Speech Sight Info: Adequate Hearing Info: Adequate Speech Info: Adequate    SPECIAL CARE FACTORS FREQUENCY  PT (By licensed PT)     PT Frequency: 5x weekly              Contractures      Additional Factors Info  Code Status, Allergies Code Status Info: Full code Allergies Info: No known allergies           Current Medications (09/24/2022):  This is the current  hospital active medication list Current Facility-Administered Medications  Medication Dose Route Frequency Provider Last Rate Last Admin   0.9 %  sodium chloride infusion   Intravenous Continuous Emokpae, Ejiroghene E, MD 75 mL/hr at 09/23/22 2008 New Bag at 09/23/22 2008   acetaminophen (TYLENOL) tablet 650 mg  650 mg Oral Q6H PRN Emokpae, Ejiroghene E, MD       Or   acetaminophen (TYLENOL) suppository 650 mg  650 mg Rectal Q6H PRN Emokpae, Ejiroghene E, MD       morphine (PF) 2 MG/ML injection 2 mg  2 mg Intravenous Q3H PRN Adefeso, Oladapo, DO   2 mg at 09/24/22 0841   polyethylene glycol (MIRALAX / GLYCOLAX) packet 17 g  17 g Oral Daily PRN Emokpae, Ejiroghene E, MD         Discharge Medications: Please see discharge summary for a list of discharge medications.  Relevant Imaging Results:  Relevant Lab Results:   Additional Information SSN: [id number]  Karn Cassis, LCSW

## 2022-09-24 NOTE — Progress Notes (Signed)
*  PRELIMINARY RESULTS* Echocardiogram 2D Echocardiogram has been performed with Definity.  Stacey Drain 09/24/2022, 10:53 AM

## 2022-09-24 NOTE — Progress Notes (Signed)
Sent message to attending daughter wanted to make note that while he was in the ED that he was having what appeared to be episodes where his arms were convoluting and what appeared to her to be a seizure but she feels as if the ER nurse disregarded her concerns. No new orders at this time pt has not had any episodes since he has been on the unit. Will make on coming nurse aware, and advised daughter and grand daughter  if they noticed to please let nursing staff know asap

## 2022-09-25 DIAGNOSIS — S72011A Unspecified intracapsular fracture of right femur, initial encounter for closed fracture: Secondary | ICD-10-CM | POA: Diagnosis not present

## 2022-09-25 MED ORDER — HEPARIN SODIUM (PORCINE) 5000 UNIT/ML IJ SOLN
5000.0000 [IU] | Freq: Three times a day (TID) | INTRAMUSCULAR | Status: AC
  Administered 2022-09-25 (×2): 5000 [IU] via SUBCUTANEOUS
  Filled 2022-09-25 (×2): qty 1

## 2022-09-25 NOTE — Plan of Care (Signed)

## 2022-09-25 NOTE — Progress Notes (Signed)
Patient less combative for remainder of shift, maintained malewick in place , patient even smiling and joking at times, compliant with care. Patient pain controlled with PRN morphine. Patient states he had a " rough night" . He has slept on and off throughout shift. His daughter brought him cheeseburger and patient ate that and some fries , patients son and daughter at bedside. Still awaiting bed at [facility] for hip repair surgery .

## 2022-09-25 NOTE — Progress Notes (Signed)
Patient accidentally soiled bed , this Clinical research associate and 2 other staff members changed bed, patient hit this writer due to the amount of pain when we turned him to change his bed linen, Gave patient PRN Morphine for pain 10/10. Patient calmed down a little and apologized he refused telemetry and states " I have enough stuff on me " notified Dr. Adela Glimpse. Will attempt to connect telemetry later .

## 2022-09-25 NOTE — Progress Notes (Addendum)
Patient anxious and agitated, pulling condom catheter off as well as telemetry and SCDs. Patient redirected and repositioned in bed multiple times. BP 177/93. Patient family at bedside asleep. Secure chat sent to Dr. Lenoria Farrier and new medication orders given.

## 2022-09-25 NOTE — Progress Notes (Addendum)
Progress Note    Frank West  BJY:[id number] DOB: [birth year]  DOA: 09/23/2022 PCP: Patient, No Pcp Per      Brief Narrative:    Medical records reviewed and are as summarized below:  Frank West is a 87 y.o. male with PMH significant for Alzheimer's dementia, CAD s/p coronary stent in 2017 who presented to the ED s/p mechanical fall at home. Patient was found on the floor by her daughter by around 9 AM in the morning. Patient reports he tripped and fall, he did not lose consciousness. He was not able to get up. He reports vomiting twice in the morning because of severe pain. At baseline patient ambulates without assistance. As per daughter she does not know the details about his cardiac history but per cardiology notes he has a stent placed in [place]. Workup in the ED reveals subcapital impacted fracture of the right femoral neck. CT head negative for acute abnormality.        Assessment/Plan:   Principal Problem:   Closed subcapital fracture of neck of right femur [facility]) Active Problems:   Fall at home, initial encounter   Alzheimer's dementia (HCC)    Body mass index is 24.56 kg/m.    Closed subcapital fracture of right femur neck, s/p mechanical fall at home: Continue analgesics as needed for pain.   Awaiting transfer to [facility] for orthopedic surgery. He has been evaluated by Dr. Wyline Mood, cardiologist.  He said patient can have hip surgery but he will recommend that surgery be done at [facility]     Coronary artery disease s/p coronary stent in 2017: 2D echo showed EF estimated at 55 to 60%, moderate LVH, grade 1 diastolic dysfunction.   PVCs Beta-blocker to be considered in the postoperative..   Alzheimer's dementia: Mental status at baseline. Denies any behavioral problems.   Fall at home: CT head negative for acute abnormality. Right knee x-ray unremarkable. PT and OT evaluation.        Diet Order             DIET  SOFT Room service appropriate? Yes; Fluid consistency: Thin  Diet effective now                            Consultants: Orthopedic surgeon Cardiologist  Procedures: Plan for right hip surgery    Medications:    Continuous Infusions:   Anti-infectives (From admission, onward)    None              Family Communication/Anticipated D/C date and plan/Code Status   DVT prophylaxis: SCDs Start: 09/23/22 1919     Code Status: Full Code  Family Communication: Plan discussed with Frank West, at the bedside Disposition Plan: To be determined   Status is: Inpatient Remains inpatient appropriate because: Right hip fracture       Subjective:   Interval events noted.  He complains of pain in the right hip. Frank West , step son, at the bedside  Objective:    Vitals:   09/24/22 0324 09/24/22 1415 09/24/22 2045 09/25/22 0443  BP: (!) 114/90 (!) 149/85 (!) 174/93 (!) 179/99  Pulse: 82 69 65 78  Resp: 18 18  20   Temp: 98.9 F (37.2 C) 98.7 F (37.1 C) 98.4 F (36.9 C) 98.3 F (36.8 C)  TempSrc:  Oral Oral Oral  SpO2: 95% 97% 98% 98%  Weight:      Height:  No data found.   Intake/Output Summary (Last 24 hours) at 09/25/2022 1246 Last data filed at 09/25/2022 0152 Gross per 24 hour  Intake 1085 ml  Output 800 ml  Net 285 ml   Filed Weights   09/23/22 1912  Weight: 81 kg    Exam:  GEN: NAD SKIN: Warm and dry EYES: No pallor or icterus ENT: MMM CV: RRR PULM: CTA B ABD: soft, ND, NT, +BS CNS: AAO x 2 (person and place), non focal EXT: No edema or tenderness        Data Reviewed:   I have personally reviewed following labs and imaging studies:  Labs: Labs show the following:   Basic Metabolic Panel: Recent Labs  Lab 09/23/22 1429 09/24/22 0358 09/24/22 0925  NA 136 136  --   K 4.1 4.3  --   CL 103 103  --   CO2 22 22  --   GLUCOSE 125* 156*  --   BUN 15 18  --   CREATININE 1.21 1.14  --   CALCIUM 8.9  8.9  --   MG  --   --  2.1   GFR Estimated Creatinine Clearance: 51.2 mL/min (by C-G formula based on SCr of 1.14 mg/dL). Liver Function Tests: Recent Labs  Lab 09/23/22 1429  AST 63*  ALT 59*  ALKPHOS 71  BILITOT 1.2  PROT 7.8  ALBUMIN 3.8   No results for input(s): "LIPASE", "AMYLASE" in the last 168 hours. No results for input(s): "AMMONIA" in the last 168 hours. Coagulation profile Recent Labs  Lab 09/23/22 1429  INR 1.1    CBC: Recent Labs  Lab 09/23/22 1429 09/24/22 0358 09/25/22 0522  WBC 14.7* 12.0* 10.0  NEUTROABS 12.5*  --   --   HGB 16.6 16.2 16.5  HCT 50.4 49.4 50.1  MCV 93.0 92.9 94.7  PLT 165 139* 101*   Cardiac Enzymes: Recent Labs  Lab 09/23/22 1429  CKTOTAL 86   BNP (last 3 results) No results for input(s): "PROBNP" in the last 8760 hours. CBG: No results for input(s): "GLUCAP" in the last 168 hours. D-Dimer: No results for input(s): "DDIMER" in the last 72 hours. Hgb A1c: No results for input(s): "HGBA1C" in the last 72 hours. Lipid Profile: Recent Labs    09/25/22 0522  CHOL 179  HDL 43  LDLCALC 120*  TRIG 78  CHOLHDL 4.2   Thyroid function studies: Recent Labs    09/24/22 0925  TSH 2.565   Anemia work up: No results for input(s): "VITAMINB12", "FOLATE", "FERRITIN", "TIBC", "IRON", "RETICCTPCT" in the last 72 hours. Sepsis Labs: Recent Labs  Lab 09/23/22 1429 09/24/22 0358 09/25/22 0522  WBC 14.7* 12.0* 10.0    Microbiology No results found for this or any previous visit (from the past 240 hour(s)).  Procedures and diagnostic studies:  ECHOCARDIOGRAM COMPLETE  Result Date: 09/24/2022    ECHOCARDIOGRAM REPORT   Patient Name:   Frank West Date of Exam: 09/24/2022 Medical Rec #:  [id number]       Height:       71.5 in Accession #:    [id number]      Weight:       178.6 lb Date of Birth:  1935-04-07       BSA:          2.020 m Patient Age:    87 years        BP:           114/90 mmHg Patient  Gender: M                HR:           48 bpm. [facility] Procedure: 2D Echo, Cardiac Doppler and Color Doppler Indications:    Chest Pain R07.9  History:        Patient has no prior history of Echocardiogram examinations.                 CAD. Lzheimer's dementia [facility]), Closed subcapital fracture of                 neck of right femur (HCC), Hx of (s/p prior stent in 2017 but                 details unavailable).  Sonographer:    Celesta Gentile RCS Referring Phys: [id number] Ellsworth Lennox  Sonographer Comments: Technically difficult study due to poor echo windows. IMPRESSIONS  1. Left ventricular ejection fraction, by estimation, is 55 to 60%. The left ventricle has normal function. The left ventricle has no regional wall motion abnormalities. There is moderate left ventricular hypertrophy. Left ventricular diastolic parameters are consistent with Grade I diastolic dysfunction (impaired relaxation).  2. Right ventricular systolic function is normal. The right ventricular size is normal.  3. The mitral valve is normal in structure. No evidence of mitral valve regurgitation. No evidence of mitral stenosis.  4. The aortic valve has an indeterminant number of cusps. There is mild calcification of the aortic valve. There is mild thickening of the aortic valve. Aortic valve regurgitation is not visualized. No aortic stenosis is present.  5. The inferior vena cava is normal in size with greater than 50% respiratory variability, suggesting right atrial pressure of 3 mmHg. FINDINGS  Left Ventricle: Left ventricular ejection fraction, by estimation, is 55 to 60%. The left ventricle has normal function. The left ventricle has no regional wall motion abnormalities. Definity contrast agent was given IV to delineate the left ventricular  endocardial borders. The left ventricular internal cavity size was normal in size. There is moderate left ventricular hypertrophy. Left ventricular diastolic parameters are consistent with Grade I  diastolic dysfunction (impaired relaxation). Right Ventricle: The right ventricular size is normal. Right vetricular wall thickness was not well visualized. Right ventricular systolic function is normal. Left Atrium: Left atrial size was normal in size. Right Atrium: Right atrial size was normal in size. Pericardium: There is no evidence of pericardial effusion. Mitral Valve: The mitral valve is normal in structure. No evidence of mitral valve regurgitation. No evidence of mitral valve stenosis. Tricuspid Valve: The tricuspid valve is normal in structure. Tricuspid valve regurgitation is not demonstrated. No evidence of tricuspid stenosis. Aortic Valve: The aortic valve has an indeterminant number of cusps. There is mild calcification of the aortic valve. There is mild thickening of the aortic valve. There is mild aortic valve annular calcification. Aortic valve regurgitation is not visualized. No aortic stenosis is present. Aortic valve mean gradient measures 5.0 mmHg. Aortic valve peak gradient measures 11.3 mmHg. Aortic valve area, by VTI measures 1.41 cm. Pulmonic Valve: The pulmonic valve was not well visualized. Pulmonic valve regurgitation is not visualized. No evidence of pulmonic stenosis. Aorta: The aortic root is normal in size and structure. Venous: The inferior vena cava is normal in size with greater than 50% respiratory variability, suggesting right atrial pressure of 3 mmHg. IAS/Shunts: No atrial level shunt detected by color flow Doppler.  LEFT VENTRICLE PLAX  2D LVIDd:         3.60 cm   Diastology LVIDs:         2.40 cm   LV e' medial:    5.66 cm/s LV PW:         1.30 cm   LV E/e' medial:  11.8 LV IVS:        1.20 cm   LV e' lateral:   10.40 cm/s LVOT diam:     2.20 cm   LV E/e' lateral: 6.4 LV SV:         54 LV SV Index:   27 LVOT Area:     3.80 cm  RIGHT VENTRICLE RV S prime:     11.20 cm/s TAPSE (M-mode): 2.4 cm LEFT ATRIUM             Index        RIGHT ATRIUM           Index LA diam:         2.80 cm 1.39 cm/m   RA Area:     15.60 cm LA Vol (A2C):   39.7 ml 19.65 ml/m  RA Volume:   36.60 ml  18.12 ml/m LA Vol (A4C):   45.5 ml 22.53 ml/m LA Biplane Vol: 43.6 ml 21.59 ml/m  AORTIC VALVE AV Area (Vmax):    1.35 cm AV Area (Vmean):   1.60 cm AV Area (VTI):     1.41 cm AV Vmax:           168.00 cm/s AV Vmean:          108.000 cm/s AV VTI:            0.382 m AV Peak Grad:      11.3 mmHg AV Mean Grad:      5.0 mmHg LVOT Vmax:         59.49 cm/s LVOT Vmean:        45.421 cm/s LVOT VTI:          0.142 m LVOT/AV VTI ratio: 0.37  AORTA Ao Root diam: 3.60 cm MITRAL VALVE MV Area (PHT): 2.09 cm     SHUNTS MV Decel Time: 363 msec     Systemic VTI:  0.14 m MV E velocity: 66.80 cm/s   Systemic Diam: 2.20 cm MV A velocity: 105.00 cm/s MV E/A ratio:  0.64 Dina Rich MD Electronically signed by Dina Rich MD Signature Date/Time: 09/24/2022/11:04:09 AM    Final    CT Hip Right Wo Contrast  Result Date: 09/23/2022 CLINICAL DATA:  Patient fell today.  Right hip pain EXAM: CT OF THE RIGHT HIP WITHOUT CONTRAST TECHNIQUE: Multidetector CT imaging of the right hip was performed according to the standard protocol. Multiplanar CT image reconstructions were also generated. RADIATION DOSE REDUCTION: This exam was performed according to the departmental dose-optimization program which includes automated exposure control, adjustment of the mA and/or kV according to patient size and/or use of iterative reconstruction technique. COMPARISON:  Radiograph performed earlier on the same date FINDINGS: Bones/Joint/Cartilage Subcapital impaction fracture of the right femoral neck with superolateral displacement of the distal femur. No joint effusion or intra-articular extension. Mild hip osteoarthritis. Sacroiliac joint and pubic symphysis are intact. Pubic bones are intact. Ligaments Suboptimally assessed by CT. Muscles and Tendons No intramuscular hematoma or fluid collection. Iliopsoas and hamstring tendons appear  intact. Soft tissues Skin and subcutaneous soft tissues are within normal limits. IMPRESSION: Subcapital impaction fracture of the right femoral neck with superolateral displacement  of the distal femur. Electronically Signed   By: Larose Hires D.O.   On: 09/23/2022 16:08   DG Hip Unilat W or Wo Pelvis 2-3 Views Right  Result Date: 09/23/2022 CLINICAL DATA:  Post fall, now with right hip pain. EXAM: DG HIP (WITH OR WITHOUT PELVIS) 2-3V RIGHT COMPARISON:  None Available. FINDINGS: There is a acute, impacted, foreshortened subcapsular right femoral neck fracture without definitive evidence of intra-articular extension. Mild degenerative change of the right hip is suspected with joint space loss, subchondral sclerosis and osteophytosis. No evidence of avascular necrosis Limited visualization of the pelvis is normal. Degenerative change of the contralateral left hip is suspected though incompletely evaluated. Degenerative change of the lower lumbar spine is suspected though incompletely evaluated Phleboliths overlie the right hemipelvis. Regional soft tissues appear otherwise normal. IMPRESSION: Acute, impacted, foreshortened subcapsular right femoral neck fracture without definitive evidence of intra-articular extension. Electronically Signed   By: Simonne Come M.D.   On: 09/23/2022 15:15   DG Knee Complete 4 Views Right  Result Date: 09/23/2022 CLINICAL DATA:  Post fall, now with right knee pain. EXAM: RIGHT KNEE - COMPLETE 4+ VIEW COMPARISON:  None Available. FINDINGS: Sequela of previous pin, cerclage wire and lag screw fixation of the patella. The superior aspect of the cerclage wire is fractured though this is favored to be chronic in etiology given associated lucency surrounding the fracture component of the cerclage wire. No definite acute fracture or dislocation. No knee joint effusion. Mild-to-moderate tricompartmental degenerative change of the knee is suspected though suboptimally evaluated due to  obliquity. No evidence of chondrocalcinosis. Regional soft tissues appear normal. IMPRESSION: 1. No acute findings. 2. Sequela of previous pin, cerclage wire and lag screw fixation of the patella. Electronically Signed   By: Simonne Come M.D.   On: 09/23/2022 15:13   CT Head Wo Contrast  Result Date: 09/23/2022 CLINICAL DATA:  Head trauma, moderate-severe EXAM: CT HEAD WITHOUT CONTRAST TECHNIQUE: Contiguous axial images were obtained from the base of the skull through the vertex without intravenous contrast. RADIATION DOSE REDUCTION: This exam was performed according to the departmental dose-optimization program which includes automated exposure control, adjustment of the mA and/or kV according to patient size and/or use of iterative reconstruction technique. COMPARISON:  None Available. FINDINGS: Brain: No evidence of acute infarction, hemorrhage, hydrocephalus, extra-axial collection or mass lesion/mass effect. Vascular: No hyperdense vessel or unexpected calcification. Skull: Normal. Negative for fracture or focal lesion. Sinuses/Orbits: No middle ear or mastoid effusion. Paranasal sinuses are clear. Orbits are unremarkable. Other: None. IMPRESSION: No CT evidence of intracranial injury. Electronically Signed   By: Lorenza Cambridge M.D.   On: 09/23/2022 14:30               LOS: 2 days   Cintya Daughety  [facility]   Pager on [email or web address]. If 7PM-7AM, please contact night-coverage at [email or web address]     09/25/2022, 12:46 PM

## 2022-09-25 NOTE — Plan of Care (Signed)
  Problem: Education: Goal: Knowledge of General Education information will improve Description Including pain rating scale, medication(s)/side effects and non-pharmacologic comfort measures Outcome: Progressing   

## 2022-09-26 DIAGNOSIS — S72011A Unspecified intracapsular fracture of right femur, initial encounter for closed fracture: Secondary | ICD-10-CM | POA: Diagnosis not present

## 2022-09-26 DIAGNOSIS — I2510 Atherosclerotic heart disease of native coronary artery without angina pectoris: Secondary | ICD-10-CM | POA: Insufficient documentation

## 2022-09-26 DIAGNOSIS — G309 Alzheimer's disease, unspecified: Secondary | ICD-10-CM | POA: Diagnosis not present

## 2022-09-26 DIAGNOSIS — W19XXXA Unspecified fall, initial encounter: Secondary | ICD-10-CM | POA: Diagnosis not present

## 2022-09-26 DIAGNOSIS — R7401 Elevation of levels of liver transaminase levels: Secondary | ICD-10-CM | POA: Insufficient documentation

## 2022-09-26 DIAGNOSIS — D696 Thrombocytopenia, unspecified: Secondary | ICD-10-CM | POA: Insufficient documentation

## 2022-09-26 LAB — MRSA NEXT GEN BY PCR, NASAL: MRSA by PCR Next Gen: NOT DETECTED

## 2022-09-26 MED ORDER — MORPHINE SULFATE (PF) 2 MG/ML IV SOLN
2.0000 mg | INTRAVENOUS | Status: DC | PRN
  Administered 2022-09-26 – 2022-09-28 (×8): 2 mg via INTRAVENOUS
  Filled 2022-09-26 (×8): qty 1

## 2022-09-26 MED ORDER — POLYETHYLENE GLYCOL 3350 17 G PO PACK
17.0000 g | PACK | Freq: Every day | ORAL | Status: DC
  Administered 2022-09-28: 17 g via ORAL
  Filled 2022-09-26 (×2): qty 1

## 2022-09-26 MED ORDER — ACETAMINOPHEN 500 MG PO TABS
1000.0000 mg | ORAL_TABLET | Freq: Three times a day (TID) | ORAL | Status: DC
  Administered 2022-09-26 – 2022-09-29 (×7): 1000 mg via ORAL
  Filled 2022-09-26 (×7): qty 2

## 2022-09-26 MED ORDER — OXYCODONE HCL 5 MG PO TABS
5.0000 mg | ORAL_TABLET | ORAL | Status: DC | PRN
  Administered 2022-09-26 – 2022-09-29 (×9): 5 mg via ORAL
  Filled 2022-09-26 (×9): qty 1

## 2022-09-26 MED ORDER — SENNOSIDES-DOCUSATE SODIUM 8.6-50 MG PO TABS
1.0000 | ORAL_TABLET | Freq: Every day | ORAL | Status: DC
  Administered 2022-09-28: 1 via ORAL
  Filled 2022-09-26 (×2): qty 1
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
## 2022-09-26 NOTE — Assessment & Plan Note (Signed)
Mild, no clinical bleeding

## 2022-09-26 NOTE — Plan of Care (Signed)

## 2022-09-26 NOTE — Assessment & Plan Note (Signed)
Resolved

## 2022-09-26 NOTE — Hospital Course (Addendum)
Frank West is an 87 y.o. M with dementia, mild, lives with family, CAD s/p PCI in 2017 in [place], who presented to [facility] with fall and right hip fracture.  Cardiology consulted at OSH, and due to inability of patient and family to clearly articulate recent cardiac-oriented questions, risk stratification was not possible and Cardiology recommended hip repair at a facility where cardiac intervention was possible so transferred to [facility].

## 2022-09-26 NOTE — Progress Notes (Signed)
Ortho Trauma Note  I have discussed case with Dr. Romeo Apple and Dr. Maryfrances Bunnell regarding this patient. Unfortunately, I do not perform arthroplasty and no other providers on today perform arthroplasty. Earliest surgery would be tomorrow. Will work to coordinate care and find Armed forces operational officer.  Roby Lofts, MD [facility] [phone] (office) [email or web address]

## 2022-09-26 NOTE — Assessment & Plan Note (Addendum)
No active symptoms.  Not adherent to aspirin.

## 2022-09-26 NOTE — Progress Notes (Signed)
  Progress Note   Patient: Frank West ION:[id number] DOB: [birth year] DOA: 09/23/2022     3 DOS: the patient was seen and examined on 09/26/2022       Brief hospital course: Mr. Abler is an 87 y.o. M with dementia, mild, lives with family, CAD s/p PCI in 2017 in [place], who presented to [facility] with fall and right hip fracture.  Cardiology consulted at OSH, and due to inability of patient and family to clearly articulate recent cardiac-oriented questions, risk stratification was not possible and Cardiology recommended hip repair at a facility where cardiac intervention was possible so transferred to [facility].     Assessment and Plan: * Closed subcapital fracture of neck of right femur (HCC) - Consult orthopedics for surgery    Coronary artery disease without angina pectoris Has no active cardiac symptoms.  Daughter thinks he is SOB, patient feels fine.  Has not seen a doctor since moving to [place] 5 years ago.  Takes no medications at baseline.  Seen preoperatively by Cardiology at [facility] who did not recommend aspirin or statin.  My understanding is perioperative initaiton of BB is associated with risk.  Transaminitis - Repeat LFTs tomorrow  Thrombocytopenia (HCC) Mild, no clinical bleeding.  Fall at home, initial encounter    Alzheimer's dementia [facility]) - Establish with PCP          Subjective: Patient has no complaints, no chest pain, dyspnea, fever, confusion.  He has right hip pain.     Physical Exam: BP (!) 148/82 (BP Location: Left Arm)   Pulse 73   Temp 97.9 F (36.6 C) (Oral)   Resp (!) 21   Ht 5' 11.5" (1.816 m)   Wt 81 kg   SpO2 98%   BMI 24.56 kg/m   Elderly adult male, blepharitis noted, interactive and appropriate RRR, no murmurs, no peripheral edema Respiratory rate normal, lungs clear without rales or wheezes Abdomen soft no tenderness palpation or guarding, no ascites or distention Attention normal, affect appropriate, judgment insight appear at baseline,  face symmetric, moves upper extremities with normal strength coordination    Data Reviewed: CBC shows mild thrombocytopenia LDL 120 Magnesium normal Basic metabolic panel yesterday unremarkable  Family Communication: Daughter at the bedside    Disposition: Status is: Inpatient To the OR for repair, then to SNF        Author: Alberteen Sam, MD 09/26/2022 11:09 AM  For on call review [email or web address].

## 2022-09-26 NOTE — Assessment & Plan Note (Addendum)
S/p posterior hip hemiarthroplasty on 7/29 by Dr. Blanchie Dessert -[facility] - Aspirin 81 twice daily x 4 weeks at discharge - Follow-up with Dr. Heber War 2 weeks

## 2022-09-27 ENCOUNTER — Inpatient Hospital Stay (HOSPITAL_COMMUNITY): Payer: Medicare Other | Admitting: Certified Registered Nurse Anesthetist

## 2022-09-27 ENCOUNTER — Encounter (HOSPITAL_COMMUNITY): Payer: Self-pay | Admitting: Internal Medicine

## 2022-09-27 ENCOUNTER — Encounter (HOSPITAL_COMMUNITY)
Admission: EM | Disposition: A | Discharge status: Skilled Nursing Facility | Payer: Self-pay | Source: Home / Self Care | Attending: Family Medicine

## 2022-09-27 ENCOUNTER — Inpatient Hospital Stay (HOSPITAL_COMMUNITY): Payer: Medicare Other

## 2022-09-27 ENCOUNTER — Other Ambulatory Visit: Payer: Self-pay

## 2022-09-27 DIAGNOSIS — S72011A Unspecified intracapsular fracture of right femur, initial encounter for closed fracture: Secondary | ICD-10-CM | POA: Diagnosis not present

## 2022-09-27 DIAGNOSIS — G308 Other Alzheimer's disease: Secondary | ICD-10-CM

## 2022-09-27 DIAGNOSIS — G309 Alzheimer's disease, unspecified: Secondary | ICD-10-CM | POA: Diagnosis not present

## 2022-09-27 DIAGNOSIS — Z96641 Presence of right artificial hip joint: Secondary | ICD-10-CM | POA: Insufficient documentation

## 2022-09-27 DIAGNOSIS — F0284 Dementia in other diseases classified elsewhere, unspecified severity, with anxiety: Secondary | ICD-10-CM | POA: Diagnosis not present

## 2022-09-27 DIAGNOSIS — W19XXXA Unspecified fall, initial encounter: Secondary | ICD-10-CM | POA: Diagnosis not present

## 2022-09-27 DIAGNOSIS — I2510 Atherosclerotic heart disease of native coronary artery without angina pectoris: Secondary | ICD-10-CM | POA: Diagnosis not present

## 2022-09-27 DIAGNOSIS — S72001A Fracture of unspecified part of neck of right femur, initial encounter for closed fracture: Secondary | ICD-10-CM | POA: Diagnosis not present

## 2022-09-27 HISTORY — PX: HIP ARTHROPLASTY: SHX981

## 2022-09-27 LAB — TYPE AND SCREEN
ABO/RH(D): O POS
Antibody Screen: NEGATIVE

## 2022-09-27 LAB — GLUCOSE, CAPILLARY: Glucose-Capillary: 149 mg/dL — ABNORMAL HIGH (ref 70–99)

## 2022-09-27 SURGERY — HEMIARTHROPLASTY, HIP, DIRECT ANTERIOR APPROACH, FOR FRACTURE
Anesthesia: Monitor Anesthesia Care | Site: Hip | Laterality: Right

## 2022-09-27 MED ORDER — EPHEDRINE SULFATE-NACL 50-0.9 MG/10ML-% IV SOSY
PREFILLED_SYRINGE | INTRAVENOUS | Status: DC | PRN
  Administered 2022-09-27 (×2): 10 mg via INTRAVENOUS

## 2022-09-27 MED ORDER — BUPIVACAINE IN DEXTROSE 0.75-8.25 % IT SOLN
INTRATHECAL | Status: DC | PRN
  Administered 2022-09-27: 2 mL via INTRATHECAL

## 2022-09-27 MED ORDER — CHLORHEXIDINE GLUCONATE 4 % EX SOLN
60.0000 mL | Freq: Once | CUTANEOUS | Status: DC
  Filled 2022-09-27: qty 60

## 2022-09-27 MED ORDER — FENTANYL CITRATE (PF) 250 MCG/5ML IJ SOLN
INTRAMUSCULAR | Status: DC | PRN
  Administered 2022-09-27: 50 ug via INTRAVENOUS

## 2022-09-27 MED ORDER — ACETAMINOPHEN 10 MG/ML IV SOLN: 1000.0000 mg | Freq: Once | INTRAVENOUS | Status: DC | PRN

## 2022-09-27 MED ORDER — PROPOFOL 500 MG/50ML IV EMUL
INTRAVENOUS | Status: DC | PRN
  Administered 2022-09-27: 50 ug/kg/min via INTRAVENOUS

## 2022-09-27 MED ORDER — CEFAZOLIN SODIUM-DEXTROSE 2-4 GM/100ML-% IV SOLN
2.0000 g | INTRAVENOUS | Status: AC
  Administered 2022-09-27: 2 g via INTRAVENOUS
  Filled 2022-09-27: qty 100

## 2022-09-27 MED ORDER — LACTATED RINGERS IV SOLN: INTRAVENOUS | Status: DC

## 2022-09-27 MED ORDER — MIDAZOLAM HCL 2 MG/2ML IJ SOLN
INTRAMUSCULAR | Status: AC
  Filled 2022-09-27: qty 2

## 2022-09-27 MED ORDER — ORAL CARE MOUTH RINSE: 15.0000 mL | Freq: Once | OROMUCOSAL | Status: AC

## 2022-09-27 MED ORDER — ALUM & MAG HYDROXIDE-SIMETH 200-200-20 MG/5ML PO SUSP
30.0000 mL | ORAL | Status: DC | PRN
  Administered 2022-09-27: 30 mL via ORAL
  Filled 2022-09-27: qty 30

## 2022-09-27 MED ORDER — BUPIVACAINE LIPOSOME 1.3 % IJ SUSP
INTRAMUSCULAR | Status: AC
  Filled 2022-09-27: qty 20

## 2022-09-27 MED ORDER — CHLORHEXIDINE GLUCONATE 0.12 % MT SOLN: 15.0000 mL | Freq: Once | OROMUCOSAL | Status: AC

## 2022-09-27 MED ORDER — PHENYLEPHRINE 80 MCG/ML (10ML) SYRINGE FOR IV PUSH (FOR BLOOD PRESSURE SUPPORT)
PREFILLED_SYRINGE | INTRAVENOUS | Status: DC | PRN
  Administered 2022-09-27 (×2): 160 ug via INTRAVENOUS

## 2022-09-27 MED ORDER — ALBUMIN HUMAN 5 % IV SOLN: INTRAVENOUS | Status: DC | PRN

## 2022-09-27 MED ORDER — ACETAMINOPHEN 500 MG PO TABS: 1000.0000 mg | ORAL_TABLET | Freq: Once | ORAL | Status: DC | PRN

## 2022-09-27 MED ORDER — FENTANYL CITRATE (PF) 250 MCG/5ML IJ SOLN
INTRAMUSCULAR | Status: AC
  Filled 2022-09-27: qty 5

## 2022-09-27 MED ORDER — CHLORHEXIDINE GLUCONATE 0.12 % MT SOLN
OROMUCOSAL | Status: AC
  Administered 2022-09-27: 15 mL via OROMUCOSAL
  Filled 2022-09-27: qty 15

## 2022-09-27 MED ORDER — ENOXAPARIN SODIUM 40 MG/0.4ML IJ SOSY
40.0000 mg | PREFILLED_SYRINGE | INTRAMUSCULAR | Status: DC
  Administered 2022-09-28 – 2022-09-29 (×2): 40 mg via SUBCUTANEOUS
  Filled 2022-09-27 (×2): qty 0.4

## 2022-09-27 MED ORDER — POLYVINYL ALCOHOL 1.4 % OP SOLN
1.0000 [drp] | OPHTHALMIC | Status: DC | PRN
  Administered 2022-09-27: 1 [drp] via OPHTHALMIC
  Filled 2022-09-27: qty 15

## 2022-09-27 MED ORDER — PHENYLEPHRINE HCL-NACL 20-0.9 MG/250ML-% IV SOLN
INTRAVENOUS | Status: DC | PRN
  Administered 2022-09-27: 50 ug/min via INTRAVENOUS

## 2022-09-27 MED ORDER — ACETAMINOPHEN 160 MG/5ML PO SOLN: 1000.0000 mg | Freq: Once | ORAL | Status: DC | PRN

## 2022-09-27 MED ORDER — 0.9 % SODIUM CHLORIDE (POUR BTL) OPTIME
TOPICAL | Status: DC | PRN
  Administered 2022-09-27: 500 mL
  Administered 2022-09-27: 1000 mL

## 2022-09-27 MED ORDER — TRANEXAMIC ACID-NACL 1000-0.7 MG/100ML-% IV SOLN
1000.0000 mg | INTRAVENOUS | Status: AC
  Administered 2022-09-27: 1000 mg via INTRAVENOUS
  Filled 2022-09-27: qty 100

## 2022-09-27 MED ORDER — FENTANYL CITRATE (PF) 100 MCG/2ML IJ SOLN: 25.0000 ug | INTRAMUSCULAR | Status: DC | PRN

## 2022-09-27 MED ORDER — PROPOFOL 10 MG/ML IV BOLUS
INTRAVENOUS | Status: DC | PRN
  Administered 2022-09-27: 20 mg via INTRAVENOUS

## 2022-09-27 MED ORDER — POVIDONE-IODINE 10 % EX SWAB
2.0000 | Freq: Once | CUTANEOUS | Status: AC
  Administered 2022-09-27: 2 via TOPICAL

## 2022-09-27 MED ORDER — SODIUM CHLORIDE 0.9 % IR SOLN
Status: DC | PRN
  Administered 2022-09-27: 3000 mL

## 2022-09-27 MED ORDER — CEFAZOLIN SODIUM-DEXTROSE 2-4 GM/100ML-% IV SOLN
2.0000 g | Freq: Three times a day (TID) | INTRAVENOUS | Status: AC
  Administered 2022-09-27 – 2022-09-28 (×2): 2 g via INTRAVENOUS
  Filled 2022-09-27 (×2): qty 100

## 2022-09-27 MED ORDER — ONDANSETRON HCL 4 MG/2ML IJ SOLN
4.0000 mg | Freq: Four times a day (QID) | INTRAMUSCULAR | Status: DC | PRN
  Administered 2022-09-27: 4 mg via INTRAVENOUS
  Filled 2022-09-27: qty 2

## 2022-09-27 SURGICAL SUPPLY — 63 items
ADH SKN CLS APL DERMABOND .7 (GAUZE/BANDAGES/DRESSINGS) ×1
APL PRP STRL LF DISP 70% ISPRP (MISCELLANEOUS) ×1
BLADE SAGITTAL 25.0X1.27X90 (BLADE) ×1 IMPLANT
BRUSH FEMORAL CANAL (MISCELLANEOUS) IMPLANT
CEMENT BONE SIMPLEX SPEEDSET (Cement) IMPLANT
CEMENT RESTRICTOR BONE PREP ST (Cement) IMPLANT
CHLORAPREP W/TINT 26 (MISCELLANEOUS) ×2 IMPLANT
CLSR STERI-STRIP ANTIMIC 1/2X4 (GAUZE/BANDAGES/DRESSINGS) IMPLANT
COVER SURGICAL LIGHT HANDLE (MISCELLANEOUS) ×1 IMPLANT
DERMABOND ADVANCED .7 DNX12 (GAUZE/BANDAGES/DRESSINGS) ×1 IMPLANT
DRAPE HALF SHEET 40X57 (DRAPES) ×1 IMPLANT
DRAPE HIP W/POCKET STRL (MISCELLANEOUS) ×1 IMPLANT
DRAPE INCISE IOBAN 66X45 STRL (DRAPES) ×1 IMPLANT
DRAPE INCISE IOBAN 85X60 (DRAPES) ×1 IMPLANT
DRAPE POUCH INSTRU U-SHP 10X18 (DRAPES) ×1 IMPLANT
DRAPE U-SHAPE 47X51 STRL (DRAPES) ×2 IMPLANT
DRSG AQUACEL AG ADV 3.5X10 (GAUZE/BANDAGES/DRESSINGS) ×1 IMPLANT
ELECT BLADE 4.0 EZ CLEAN MEGAD (MISCELLANEOUS) ×1
ELECT REM PT RETURN 15FT ADLT (MISCELLANEOUS) ×1 IMPLANT
ELECTRODE BLDE 4.0 EZ CLN MEGD (MISCELLANEOUS) ×1 IMPLANT
GLOVE BIO SURGEON STRL SZ 6.5 (GLOVE) ×1 IMPLANT
GLOVE BIOGEL PI IND STRL 6.5 (GLOVE) ×1 IMPLANT
GLOVE SRG 8 PF TXTR STRL LF DI (GLOVE) ×1 IMPLANT
GLOVE SURG ORTHO LTX SZ8 (GLOVE) ×2 IMPLANT
GLOVE SURG UNDER POLY LF SZ8 (GLOVE) ×1
GOWN STRL REUS W/ TWL LRG LVL3 (GOWN DISPOSABLE) ×1 IMPLANT
GOWN STRL REUS W/ TWL XL LVL3 (GOWN DISPOSABLE) ×2 IMPLANT
GOWN STRL REUS W/TWL LRG LVL3 (GOWN DISPOSABLE) ×1
GOWN STRL REUS W/TWL XL LVL3 (GOWN DISPOSABLE) ×2
HANDPIECE INTERPULSE COAX TIP (DISPOSABLE)
HEAD ACET UNI HIP BP 53X28 (Head) IMPLANT
HEAD FEM LFIT V40 28MM +8 (Head) IMPLANT
HOOD PEEL AWAY T7 (MISCELLANEOUS) ×3 IMPLANT
KIT BASIN OR (CUSTOM PROCEDURE TRAY) ×1 IMPLANT
KIT TURNOVER KIT A (KITS) ×1 IMPLANT
MANIFOLD NEPTUNE II (INSTRUMENTS) ×1 IMPLANT
MARKER SKIN DUAL TIP RULER LAB (MISCELLANEOUS) ×1 IMPLANT
NDL 18GX1X1/2 (RX/OR ONLY) (NEEDLE) ×2 IMPLANT
NEEDLE 18GX1X1/2 (RX/OR ONLY) (NEEDLE) ×2 IMPLANT
NS IRRIG 1000ML POUR BTL (IV SOLUTION) ×1 IMPLANT
PACK TOTAL JOINT (CUSTOM PROCEDURE TRAY) ×1 IMPLANT
PRESSURIZER FEMORAL UNIV (MISCELLANEOUS) ×1 IMPLANT
RETRIEVER SUT HEWSON (MISCELLANEOUS) ×1 IMPLANT
SEALER BIPOLAR AQUA 6.0 (INSTRUMENTS) IMPLANT
SET HNDPC FAN SPRY TIP SCT (DISPOSABLE) IMPLANT
SET INTERPULSE LAVAGE W/TIP (ORTHOPEDIC DISPOSABLE SUPPLIES) IMPLANT
SPONGE T-LAP 18X18 ~~LOC~~+RFID (SPONGE) IMPLANT
STEM FEM CMT SZ7 V40 40X158 (Stem) IMPLANT
SUCTION TUBE FRAZIER 10FR DISP (SUCTIONS) ×1 IMPLANT
SUT BONE WAX W31G (SUTURE) ×1 IMPLANT
SUT ETHIBOND 2 V 37 (SUTURE) ×1 IMPLANT
SUT MNCRL AB 3-0 PS2 27 (SUTURE) ×1 IMPLANT
SUT STRATAFIX 1PDS 45CM VIOLET (SUTURE) ×2 IMPLANT
SUT VIC AB 0 CT1 27 (SUTURE) ×1
SUT VIC AB 0 CT1 27XBRD ANBCTR (SUTURE) ×1 IMPLANT
SUT VIC AB 2-0 CT2 27 (SUTURE) ×2 IMPLANT
SYR 20ML LL LF (SYRINGE) ×1 IMPLANT
SYR 50ML LL SCALE MARK (SYRINGE) ×1 IMPLANT
TOWEL GREEN STERILE (TOWEL DISPOSABLE) ×1 IMPLANT
TOWER CARTRIDGE SMART MIX (DISPOSABLE) IMPLANT
TRAY FOLEY MTR SLVR 16FR STAT (SET/KITS/TRAYS/PACK) IMPLANT
TUBE SUCT ARGYLE STRL (TUBING) ×1 IMPLANT
WATER STERILE IRR 1000ML POUR (IV SOLUTION) ×1 IMPLANT

## 2022-09-27 NOTE — Transfer of Care (Signed)
Immediate Anesthesia Transfer of Care Note  Patient: Frank West  Procedure(s) Performed: POSTERIOR HIP HEMIATHROPLASTY (Right: Hip)  Patient Location: PACU  Anesthesia Type:MAC combined with regional for post-op pain  Level of Consciousness: drowsy  Airway & Oxygen Therapy: Patient Spontanous Breathing  Post-op Assessment: Report given to RN and Post -op Vital signs reviewed and stable  Post vital signs: Reviewed and stable  Last Vitals:  Vitals Value Taken Time  BP 101/69 09/27/22 1819  Temp 98   Pulse 99 09/27/22 1828  Resp 20 09/27/22 1828  SpO2 95 % 09/27/22 1828  Vitals shown include unfiled device data.  Last Pain:  Vitals:   09/27/22 1433  TempSrc: Oral  PainSc:       Patients Stated Pain Goal: 0 (09/27/22 1431)  Complications: No notable events documented.

## 2022-09-27 NOTE — Discharge Instructions (Signed)
INSTRUCTIONS AFTER JOINT REPLACEMENT   Remove items at home which could result in a fall. This includes throw rugs or furniture in walking pathways ICE to the affected joint every three hours while awake for 30 minutes at a time, for at least the first 3-5 days, and then as needed for pain and swelling.  Continue to use ice for pain and swelling. You may notice swelling that will progress down to the foot and ankle.  This is normal after surgery.  Elevate your leg when you are not up walking on it.   Continue to use the breathing machine you got in the hospital (incentive spirometer) which will help keep your temperature down.  It is common for your temperature to cycle up and down following surgery, especially at night when you are not up moving around and exerting yourself.  The breathing machine keeps your lungs expanded and your temperature down.  DIET:  As you were doing prior to hospitalization, we recommend a well-balanced diet.  DRESSING / WOUND CARE / SHOWERING  Keep the surgical dressing until follow up.  The dressing is NOT water proof, so this must be covered when showering.  IF THE DRESSING FALLS OFF or the wound gets wet inside, change the dressing with sterile gauze or another Mepilex bandage.  Please use good hand washing techniques before changing the dressing.  Do not use any lotions or creams on the incision until instructed by your surgeon.    ACTIVITY  Increase activity slowly as tolerated, but follow the weight bearing instructions below.   No driving for 6 weeks or until further direction given by your physician.  You cannot drive while taking narcotics.  No lifting or carrying greater than 10 lbs. until further directed by your surgeon. Avoid periods of inactivity such as sitting longer than an hour when not asleep. This helps prevent blood clots.  You may return to work once you are authorized by your doctor.     WEIGHT BEARING   Weight bearing as tolerated with  assist device (walker, cane, etc) as directed, use it as long as suggested by your surgeon or therapist, typically at least 4-6 weeks.   EXERCISES  Results after joint replacement surgery are often greatly improved when you follow the exercise, range of motion and muscle strengthening exercises prescribed by your doctor. Safety measures are also important to protect the joint from further injury. Any time any of these exercises cause you to have increased pain or swelling, decrease what you are doing until you are comfortable again and then slowly increase them. If you have problems or questions, call your caregiver or physical therapist for advice.   Rehabilitation is important following a joint replacement. After just a few days of immobilization, the muscles of the leg can become weakened and shrink (atrophy).  These exercises are designed to build up the tone and strength of the thigh and leg muscles and to improve motion. Often times heat used for twenty to thirty minutes before working out will loosen up your tissues and help with improving the range of motion but do not use heat for the first two weeks following surgery (sometimes heat can increase post-operative swelling).   These exercises can be done on a training (exercise) mat, on the floor, on a table or on a bed. Use whatever works the best and is most comfortable for you.    Use music or television while you are exercising so that the exercises are a pleasant  break in your day. This will make your life better with the exercises acting as a break in your routine that you can look forward to.   Perform all exercises about fifteen times, three times per day or as directed.  You should exercise both the operative leg and the other leg as well.  Exercises include:   Quad Sets - Tighten up the muscle on the front of the thigh (Quad) and hold for 5-10 seconds.   Straight Leg Raises - With your knee straight (if you were given a brace, keep it  on), lift the leg to 60 degrees, hold for 3 seconds, and slowly lower the leg.  Perform this exercise against resistance later as your leg gets stronger.  Leg Slides: Lying on your back, slowly slide your foot toward your buttocks, bending your knee up off the floor (only go as far as is comfortable). Then slowly slide your foot back down until your leg is flat on the floor again.  Angel Wings: Lying on your back spread your legs to the side as far apart as you can without causing discomfort.  Hamstring Strength:  Lying on your back, push your heel against the floor with your leg straight by tightening up the muscles of your buttocks.  Repeat, but this time bend your knee to a comfortable angle, and push your heel against the floor.  You may put a pillow under the heel to make it more comfortable if necessary.   A rehabilitation program following joint replacement surgery can speed recovery and prevent re-injury in the future due to weakened muscles. Contact your doctor or a physical therapist for more information on knee rehabilitation.    CONSTIPATION  Constipation is defined medically as fewer than three stools per week and severe constipation as less than one stool per week.  Even if you have a regular bowel pattern at home, your normal regimen is likely to be disrupted due to multiple reasons following surgery.  Combination of anesthesia, postoperative narcotics, change in appetite and fluid intake all can affect your bowels.   YOU MUST use at least one of the following options; they are listed in order of increasing strength to get the job done.  They are all available over the counter, and you may need to use some, POSSIBLY even all of these options:    Drink plenty of fluids (prune juice may be helpful) and high fiber foods Colace 100 mg by mouth twice a day  Senokot for constipation as directed and as needed Dulcolax (bisacodyl), take with full glass of water  Miralax (polyethylene glycol)  once or twice a day as needed.  If you have tried all these things and are unable to have a bowel movement in the first 3-4 days after surgery call either your surgeon or your primary doctor.    If you experience loose stools or diarrhea, hold the medications until you stool forms back up.  If your symptoms do not get better within 1 week or if they get worse, check with your doctor.  If you experience "the worst abdominal pain ever" or develop nausea or vomiting, please contact the office immediately for further recommendations for treatment.   ITCHING:  If you experience itching with your medications, try taking only a single pain pill, or even half a pain pill at a time.  You can also use Benadryl over the counter for itching or also to help with sleep.   TED HOSE STOCKINGS:  Use  stockings on both legs until for at least 2 weeks or as directed by physician office. They may be removed at night for sleeping.  MEDICATIONS:  See your medication summary on the "After Visit Summary" that nursing will review with you.  You may have some home medications which will be placed on hold until you complete the course of blood thinner medication.  It is important for you to complete the blood thinner medication as prescribed.  Blood clot prevention (DVT Prophylaxis): After surgery you are at an increased risk for a blood clot. you were prescribed a blood thinner, Aspirin 81mg , to be taken twice daily for a total of 4 weeks from surgery to help reduce your risk of getting a blood clot. This will help prevent a blood clot. Signs of a pulmonary embolus (blood clot in the lungs) include sudden short of breath, feeling lightheaded or dizzy, chest pain with a deep breath, rapid pulse rapid breathing. Signs of a blood clot in your arms or legs include new unexplained swelling and cramping, warm, red or darkened skin around the painful area. Please call the office or 911 right away if these signs or symptoms  develop.  PRECAUTIONS:  If you experience chest pain or shortness of breath - call 911 immediately for transfer to the hospital emergency department.   If you develop a fever greater that 101 F, purulent drainage from wound, increased redness or drainage from wound, foul odor from the wound/dressing, or calf pain - CONTACT YOUR SURGEON.                                                   FOLLOW-UP APPOINTMENTS:  If you do not already have a post-op appointment, please call the office for an appointment to be seen by your surgeon.  Guidelines for how soon to be seen are listed in your "After Visit Summary", but are typically between 2-3 weeks after surgery.  OTHER INSTRUCTIONS: An antibiotic has also bee prescribed.  Please take this for the full 7-day course as instructed.  Always take with plenty of food and water.   POST-OPERATIVE OPIOID TAPER INSTRUCTIONS: It is important to wean off of your opioid medication as soon as possible. If you do not need pain medication after your surgery it is ok to stop day one. Opioids include: Codeine, Hydrocodone(Norco, Vicodin), Oxycodone(Percocet, oxycontin) and hydromorphone amongst others.  Long term and even short term use of opiods can cause: Increased pain response Dependence Constipation Depression Respiratory depression And more.  Withdrawal symptoms can include Flu like symptoms Nausea, vomiting And more Techniques to manage these symptoms Hydrate well Eat regular healthy meals Stay active Use relaxation techniques(deep breathing, meditating, yoga) Do Not substitute Alcohol to help with tapering If you have been on opioids for less than two weeks and do not have pain than it is ok to stop all together.  Plan to wean off of opioids This plan should start within one week post op of your joint replacement. Maintain the same interval or time between taking each dose and first decrease the dose.  Cut the total daily intake of opioids by one  tablet each day Next start to increase the time between doses. The last dose that should be eliminated is the evening dose.   MAKE SURE YOU:  Understand these instructions.  Get help right  away if you are not doing well or get worse.    Thank you for letting us be a part of your medical care team.  It is a privilege we respect greatly.  We hope these instructions will help you stay on track for a fast and full recovery!

## 2022-09-27 NOTE — Progress Notes (Signed)
  Progress Note   Patient: Frank West ZOX:[id number] DOB: May 07, 1935 DOA: 09/23/2022     4 DOS: the patient was seen and examined on 09/27/2022 by 10:24 AM      Brief hospital course: Frank West is an 87 y.o. M with dementia, mild, lives with family, CAD s/p PCI in 2017 in [place], who presented to [facility] with fall and right hip fracture.      Assessment and Plan: * Closed subcapital fracture of neck of right femur (HCC) - Consult orthopedics for surgery -To the OR today for right hip hemiarthroplasty   Coronary artery disease without angina pectoris No active cardiac symptoms.  Reasonable cardiac risk for proceeding to the OR today.  Transaminitis Minimally elevated  Thrombocytopenia (HCC) Resolved spontaneously  Fall at home, initial encounter    Alzheimer's dementia [facility]) - Establish with PCP          Subjective: Patient has no complaints.  Nursing note that his belly seems slightly distended.  Patient denies chest pain, dyspnea.  He is slightly sleepy due to pain medicine.     Physical Exam: BP 128/87 (BP Location: Right Arm)   Pulse 89   Temp 97.6 F (36.4 C)   Resp 18   Ht 5' 11.5" (1.816 m)   Wt 81 kg   SpO2 96%   BMI 24.56 kg/m   Thin elderly adult male, lying in bed, sleepy but interactive and oriented to person, place, and time RRR, minimal systolic murmur, no peripheral edema Respiratory rate normal, lungs clear without rales or wheezes Abdomen rotund, no tenderness to palpation, soft, no guarding Sleepy, oriented to self, place, and time, moves upper extremities with generalized weakness but symmetric strength, speech fluent     Data Reviewed: CBC shows stable hemoglobin, no thrombocytopenia Basic metabolic panel shows minimal hyponatremia and hypokalemia, clinically insignificant, renal function stable, transaminases stable  Family Communication: Daughter    Disposition: Status is: Inpatient         Author: Alberteen Sam, MD 09/27/2022 2:31 PM  For on call review [email or web address].

## 2022-09-27 NOTE — TOC CAGE-AID Note (Signed)
Transition of Care [facility]) - CAGE-AID Screening   Patient Details  Name: Frank West MRN: [id number] Date of Birth: 23-Dec-1935  Transition of Care [facility]) CM/SW Contact:    Katha Hamming, RN Phone Number: 09/27/2022, 7:37 PM   Clinical Narrative:    CAGE-AID Screening: Substance Abuse Screening unable to be completed due to: : Patient unable to participate (hx dementia)     Hx alzheimer's

## 2022-09-27 NOTE — Progress Notes (Signed)
Patient discussed with primary team. From cardiac standpoint we would be ok with surgery being done at either [facility] or [facility], cardiac services are readily available at either site if needed   Dina Rich MD

## 2022-09-27 NOTE — Consult Note (Signed)
ORTHOPAEDIC CONSULTATION  REQUESTING PHYSICIAN: Alberteen Sam, *  Chief Complaint: Right femoral neck fracture  HPI: Frank West is a 87 y.o. male who  sustained a right femoral neck fracture after a fall on 09/23/22.  He was transferred to [facility] yesterday for further management of his fracture.  He localizes pain to the right hip and groin area.  He denies pain in other joints or extremities.  He denies distal numbness and tingling.  He resides with his daughter who takes care of him.  I spoke with his daughter over the phone.  Past Medical History:  Diagnosis Date   Alzheimer disease (HCC)    CAD (coronary artery disease)    a. s/p prior stent in 2017 but details unavailable   Past Surgical History:  Procedure Laterality Date   CORONARY ANGIOPLASTY WITH STENT PLACEMENT     IR TRANSCATHETER BX  2007   done in [place]    uroscopy  2011   Social History   Socioeconomic History   Marital status: Widowed    Spouse name: Not on file   Number of children: Not on file   Years of education: Not on file   Highest education level: Not on file  Occupational History   Not on file  Tobacco Use   Smoking status: Former   Smokeless tobacco: Never  Substance and Sexual Activity   Alcohol use: Never   Drug use: Never   Sexual activity: Not on file  Other Topics Concern   Not on file  Social History Narrative   Not on file   Social Determinants of Health   Financial Resource Strain: Not on file  Food Insecurity: No Food Insecurity (09/23/2022)   Hunger Vital Sign    Worried About Running Out of Food in the Last Year: Never true    Ran Out of Food in the Last Year: Never true  Transportation Needs: No Transportation Needs (09/23/2022)   PRAPARE - Administrator, Civil Service (Medical): No    Lack of Transportation (Non-Medical): No  Physical Activity: Not on file  Stress: Not on file  Social Connections: Not on file   Family History  Problem Relation  Age of Onset   Melanoma Father    Obesity Brother    No Known Allergies   Positive ROS: All other systems have been reviewed and were otherwise negative with the exception of those mentioned in the HPI and as above.  Physical Exam: General: Alert, no acute distress Cardiovascular: No pedal edema Respiratory: No cyanosis, no use of accessory musculature Skin: No lesions in the area of chief complaint Neurologic: Sensation intact distally Psychiatric: Patient is competent for consent with normal mood and affect  MUSCULOSKELETAL:  LLE No traumatic wounds, ecchymosis, or rash  Nontender  No groin pain with log roll  No knee or ankle effusion  Knee stable to varus/ valgus stress  Sens DPN, SPN, TN intact  Motor EHL, ext, flex 5/5  DP 2+, PT 2+, No significant edema RLE No traumatic wounds, ecchymosis, or rash  Nontender  groin pain with log roll  No knee or ankle effusion  Knee stable to varus/ valgus stress  Sens DPN, SPN, TN intact  Motor EHL, ext, flex 5/5  DP 2+, PT 2+, No significant edema   IMAGING: X-rays pelvis right hip demonstrate displaced right femoral neck fracture  Assessment: Principal Problem:   Closed subcapital fracture of neck of right femur (HCC) Active Problems:  Alzheimer's dementia (HCC)   Fall at home, initial encounter   Thrombocytopenia (HCC)   Transaminitis   Coronary artery disease without angina pectoris   Right displaced femoral neck fracture  Plan: Discussed with patient and his daughter over the phone that he has a displaced right femoral neck fracture would benefit from hip hemiarthroplasty.  OR availability is limited today.  Will plan to proceed with surgery either this afternoon at [facility] or this evening at [facility].  Per cardiology, the patient is safe to have the surgery done at either location.  The risks benefits and alternatives were discussed with the patient and his daughter including but not limited to the risks of  nonoperative treatment, versus surgical intervention including infection, bleeding, nerve injury, periprosthetic fracture, the need for revision surgery, dislocation, leg length discrepancy, blood clots, cardiopulmonary complications, morbidity, mortality, among others, and they were willing to proceed.       Joen Laura, MD  Contact information:   [id number] 7am-5pm epic message Dr. Blanchie Dessert, or call office for patient follow up: [phone] After hours and holidays please check [email or web address] for group call information for [facility]

## 2022-09-27 NOTE — Care Management Important Message (Signed)
Important Message  Patient Details  Name: Frank West MRN: [id number] Date of Birth: 1936/02/05   Medicare Important Message Given:  Yes     Sherilyn Banker 09/27/2022, 1:24 PM

## 2022-09-27 NOTE — Anesthesia Preprocedure Evaluation (Signed)
Anesthesia Evaluation  Patient identified by MRN, date of birth, ID band  Reviewed: Allergy & Precautions, NPO status , Patient's Chart, lab work & pertinent test results  History of Anesthesia Complications Negative for: history of anesthetic complications  Airway Mallampati: III  TM Distance: >3 FB Neck ROM: Full    Dental  (+) Dental Advisory Given   Pulmonary neg shortness of breath, neg sleep apnea, neg COPD, neg recent URI, former smoker   breath sounds clear to auscultation       Cardiovascular (-) angina + CAD and + Cardiac Stents   Rhythm:Regular   1. Left ventricular ejection fraction, by estimation, is 55 to 60%. The  left ventricle has normal function. The left ventricle has no regional  wall motion abnormalities. There is moderate left ventricular hypertrophy.  Left ventricular diastolic  parameters are consistent with Grade I diastolic dysfunction (impaired  relaxation).   2. Right ventricular systolic function is normal. The right ventricular  size is normal.   3. The mitral valve is normal in structure. No evidence of mitral valve  regurgitation. No evidence of mitral stenosis.   4. The aortic valve has an indeterminant number of cusps. There is mild  calcification of the aortic valve. There is mild thickening of the aortic  valve. Aortic valve regurgitation is not visualized. No aortic stenosis is  present.   5. The inferior vena cava is normal in size with greater than 50%  respiratory variability, suggesting right atrial pressure of 3 mmHg.     Neuro/Psych  PSYCHIATRIC DISORDERS     Dementia negative neurological ROS     GI/Hepatic negative GI ROS,,,Lab Results      Component                Value               Date                      ALT                      44                  09/27/2022                AST                      50 (H)              09/27/2022                ALKPHOS                  63                   09/27/2022                BILITOT                  2.1 (H)             09/27/2022              Endo/Other  negative endocrine ROS    Renal/GU Renal InsufficiencyRenal disease     Musculoskeletal  Right displaced femoral neck fracture   Abdominal   Peds  Hematology Lab Results      Component  Value               Date                      WBC                      12.1 (H)            09/27/2022                HGB                      16.3                09/27/2022                HCT                      50.9                09/27/2022                MCV                      93.2                09/27/2022                PLT                      153                 09/27/2022            Lab Results      Component                Value               Date                      INR                      1.1                 09/23/2022             Denies blood thinners   Anesthesia Other Findings   Reproductive/Obstetrics                             Anesthesia Physical Anesthesia Plan  ASA: 3  Anesthesia Plan: MAC and Spinal   Post-op Pain Management: Minimal or no pain anticipated   Induction: Intravenous  PONV Risk Score and Plan: 1 and Propofol infusion and Treatment may vary due to age or medical condition  Airway Management Planned: Nasal Cannula, Natural Airway and Simple Face Mask  Additional Equipment: None  Intra-op Plan:   Post-operative Plan:   Informed Consent: I have reviewed the patients History and Physical, chart, labs and discussed the procedure including the risks, benefits and alternatives for the proposed anesthesia with the patient or authorized representative who has indicated his/her understanding and acceptance.     Dental advisory given  Plan Discussed with: CRNA  Anesthesia Plan Comments:        Anesthesia Quick Evaluation

## 2022-09-27 NOTE — Anesthesia Procedure Notes (Signed)
Spinal  Patient location during procedure: OR Start time: 09/27/2022 4:05 PM End time: 09/27/2022 4:09 PM Reason for block: surgical anesthesia Staffing Performed: anesthesiologist  Anesthesiologist: Linton Rump, MD Performed by: Linton Rump, MD Authorized by: Linton Rump, MD   Preanesthetic Checklist Completed: patient identified, IV checked, site marked, risks and benefits discussed, surgical consent, monitors and equipment checked, pre-op evaluation and timeout performed Spinal Block Patient position: sitting Prep: DuraPrep Patient monitoring: blood pressure and continuous pulse ox Approach: midline Location: L3-4 Injection technique: single-shot Needle Needle type: Pencan  Needle gauge: 24 G Needle length: 9 cm Additional Notes Risks and benefits of neuraxial anesthesia including, but not limited to, infection, bleeding, local anesthetic toxicity, headache, hypotension, back pain, block failure, etc. were discussed with the patient. The patient expressed understanding and consented to the procedure. I confirmed that the patient has no bleeding disorders and is not taking blood thinners. I confirmed the patient's last platelet count with the nurse. Monitors were applied. A time-out was performed immediately prior to the procedure. Sterile technique was used throughout the whole procedure.

## 2022-09-27 NOTE — Op Note (Signed)
09/23/2022 - 09/27/2022  5:42 PM  PATIENT:  Frank West   MRN: [id number]  PRE-OPERATIVE DIAGNOSIS:  Right displaced femoral neck fracture  POST-OPERATIVE DIAGNOSIS:  Right displaced femoral neck fracture  PROCEDURE:  Procedure(s): POSTERIOR HIP HEMIATHROPLASTY  PREOPERATIVE INDICATIONS:  Frank West is an 87 y.o. male who was admitted 09/23/2022 with a diagnosis of Closed subcapital fracture of neck of right femur (HCC) and elected for surgical management.  The risks benefits and alternatives were discussed with the patient including but not limited to the risks of nonoperative treatment, versus surgical intervention including infection, bleeding, nerve injury, periprosthetic fracture, the need for revision surgery, dislocation, leg length discrepancy, blood clots, cardiopulmonary complications, morbidity, mortality, among others, and they were willing to proceed.  Predicted outcome is good, although there will be at least a six to nine month expected recovery.   OPERATIVE REPORT     SURGEON:  Weber Cooks, MD    ASSISTANT: Kathie Dike, PA-C (Present throughout the entire procedure,  necessary for completion of procedure in a timely manner, assisting with retraction, instrumentation, and closure)     ANESTHESIA: Spinal  ESTIMATED BLOOD LOSS: 200cc    COMPLICATIONS:  None.     COMPONENTS:  Cemented Accolade C with 127 degree neck angle, 53 mm bipolar Hemi head, 28+8 cobalt chrome interval Implant Name Type Inv. Item Serial No. Manufacturer Lot No. LRB No. Used Action  CEMENT RESTRICTOR BONE PREP ST - [id number] Cement CEMENT RESTRICTOR BONE PREP ST  [employer] [id number] Right 1 Implanted  CEMENT BONE SIMPLEX SPEEDSET - [id number] Cement CEMENT BONE SIMPLEX SPEEDSET  [employer] [id number] Right 2 Implanted  STEM FEM CMT SZ7 V40 [id number] - [id number] Stem STEM FEM CMT SZ7 V40 40X158  [employer] [id number] Right 1 Implanted  HEAD FEM LFIT V40 +8 -  [id number] Head HEAD FEM LFIT V40 +8  [employer] [id number] Right 1 Implanted  universal head bipolar component    [employer] [id number] Right 1 Implanted    The aquamantis was utilized for this case to help facilitate better hemostasis as patient was felt to be at increased risk of bleeding because of recent anticoagulation use.        PROCEDURE IN DETAIL: The patient was met in the holding area and identified.  The appropriate hip  was marked at the operative site. The patient was then transported to the OR and  placed under anesthesia.  At that point, the patient was  placed in the lateral decubitus position with the operative side up and  secured to the operating room table and all bony prominences padded. A subaxillary role was placed.    The operative lower extremity was prepped from the iliac crest to the ankle.  Sterile draping was performed.  2g of ancef and 1g TXA were given prior to incision. Time out was performed prior to incision.      A routine posterolateral approach was utilized via sharp dissection  carried down to the subcutaneous tissue.  Gross bleeders were Bovie  coagulated.  The iliotibial band was identified and incised  along the length of the skin incision.  A Charnley retractor was inserted with care to protect the sciatic nerve.  With the hip internally rotated, the short external rotators  were identified. The piriformis was tagged with #2 Ethibond, and the hip capsule released in a T-type fashion, and posterior sleeve of the capsule was also tagged.  The femoral neck was exposed, and I resected the  femoral neck using the appropriate jig. This was performed at approximately a thumb's breadth above the lesser trochanter.    I then exposed the deep acetabulum, cleared out any tissue including the ligamentum teres.    I then prepared the proximal femur using the box cutter, Charnley awl, and then sequentially broached.  A trial utilized, and I reduced  the hip, leg lengths were assessed clinically and felt to be equal. The hip was then taken through a full range of motion, the hip was stable at full extension and 90 degrees external rotation without anterior subluxation. The hip was also stable in the position of sleep, and in neutral abduction up to 90 degrees flexion, and 90 degrees IR. The trial components were then removed.   We then prepared canal for cementation.  The cement restrictor was measured and inserted distally.  The canal was then irrigated with the pulse lavage and 3 L of normal saline.  2 bags of Simplex cement were prepared.  Using the cement gun the cement was inserted distally and the canal was filled.  We then pressurized the canal. The real implant was then inserted matching the patient's native anteversion of approximately 25 degrees.  We then waited for 13 minutes for the cement to be fully set.  Excess cement was removed.  A lap was placed in the acetabulum prior to cementing was also removed and the acetabulum was assessed to make sure there was no cement or bone fragments.  The hip was then reduced with the trial head again and taken through functional range of motion and found to have excellent stability. Leg lengths were restored. The real head was then impacted onto the stem and the hip was again reduced.  The capsule was then repaired with #2  Ethibond., and the piriformis was repaired to the abductor tendon. Excellent posterior capsular repair was achieved.   I then irrigated the hip copiously again with pulse lavage. The wounds were injected with 20cc exparal diluted in sterile saline. The fascia and IT band was repaired with #1 stratafix, followed by 0 stratafix for the fat layer followed by 2-0 Vicryl and running 3-0 Monocryl for the skin, Dermabond was applied and an Aquacel dressing was placed.  The patient was then awakened and returned to PACU in stable and satisfactory condition. There were no complications.  Post  op recs: WB: WBAT with no formal hip precautions Abx: ancef x23 hours post op Imaging: PACU xrays Dressing: Aquacel dressing to be kept intact until follow-up DVT prophylaxis: lovenox starting POD1, dc on aspirin 81mg  BID x4 weeks Follow up: 2 weeks after surgery for a wound check with Dr. Blanchie Dessert at [facility].  Address: [address], [place], [place] [address]  Office Phone: [phone]   Weber Cooks, MD Orthopedic Surgeon  09/27/2022 5:42 PM

## 2022-09-28 DIAGNOSIS — W19XXXA Unspecified fall, initial encounter: Secondary | ICD-10-CM | POA: Diagnosis not present

## 2022-09-28 DIAGNOSIS — S72011A Unspecified intracapsular fracture of right femur, initial encounter for closed fracture: Secondary | ICD-10-CM | POA: Diagnosis not present

## 2022-09-28 DIAGNOSIS — G309 Alzheimer's disease, unspecified: Secondary | ICD-10-CM | POA: Diagnosis not present

## 2022-09-28 DIAGNOSIS — I2510 Atherosclerotic heart disease of native coronary artery without angina pectoris: Secondary | ICD-10-CM | POA: Diagnosis not present

## 2022-09-28 NOTE — TOC Progression Note (Addendum)
Transition of Care [facility]) - Progression Note    Patient Details  Name: Frank West MRN: [id number] Date of Birth: 31-Mar-1935  Transition of [facility]) CM/SW Contact  Lorri Frederick, LCSW Phone Number: 09/28/2022, 3:28 PM  Clinical Narrative:  CSW spoke with pt and daughter [name] and [name] regarding SNF.  They are still in agreement with this plan. Pt referral was sent out pre-surgery, CSW shared 3 current bed offers: [address], [address], [place].  They would like responses from [facility] and [facility].  CSW reached out to those facilities.  1545: [facility]--can offer bed.  Does want to confirm that pt is not agitated prior to pt being admitted.    Expected Discharge Plan: Skilled Nursing Facility Barriers to Discharge: Continued Medical Work up  Expected Discharge Plan and Services In-house Referral: Clinical Social Work   Post Acute Care Choice: Skilled Nursing Facility Living arrangements for the past 2 months: Single Family Home                                       Social Determinants of Health (SDOH) Interventions SDOH Screenings   Food Insecurity: No Food Insecurity (09/23/2022)  Housing: Low Risk  (09/23/2022)  Transportation Needs: No Transportation Needs (09/23/2022)  Utilities: Not At Risk (09/23/2022)  Tobacco Use: Medium Risk (09/27/2022)    Readmission Risk Interventions     No data to display

## 2022-09-28 NOTE — NC FL2 (Signed)
MEDICAID FL2 LEVEL OF CARE FORM     IDENTIFICATION  Patient Name: Frank West Birthdate: [birth year] Sex: male Admission Date (Current Location): 09/23/2022  [facility] and [place] Number:  Producer, television/film/video and Address:  [facility], [address] [place], [place] [address]      Provider Number: [id number]  Attending Physician Name and Address:  Alberteen Sam, *  Relative Name and Phone Number:  Daehnke,Tracy Daughter   [phone]    Current Level of Care: Hospital Recommended Level of Care: Skilled Nursing Facility Prior Approval Number:    Date Approved/Denied:   PASRR Number: [id number]  Discharge Plan: SNF    Current Diagnoses: Patient Active Problem List   Diagnosis Date Noted   Thrombocytopenia (HCC) 09/26/2022   Transaminitis 09/26/2022   Coronary artery disease without angina pectoris 09/26/2022   Closed subcapital fracture of neck of right femur (HCC) 09/23/2022   Alzheimer's dementia (HCC) 09/23/2022   Fall at home, initial encounter 09/23/2022    Orientation RESPIRATION BLADDER Height & Weight     Self, Place  Normal Incontinent Weight: 178 lb 9.2 oz (81 kg) Height:  5' 11.5" (181.6 cm)  BEHAVIORAL SYMPTOMS/MOOD NEUROLOGICAL BOWEL NUTRITION STATUS      Incontinent Diet (see discharge summary)  AMBULATORY STATUS COMMUNICATION OF NEEDS Skin   Total Care Verbally Surgical wounds                       Personal Care Assistance Level of Assistance  Bathing, Feeding, Dressing, Total care Bathing Assistance: Maximum assistance Feeding assistance: Independent Dressing Assistance: Maximum assistance Total Care Assistance: Maximum assistance   Functional Limitations Info  Sight, Hearing, Speech Sight Info: Adequate Hearing Info: Adequate Speech Info: Adequate    SPECIAL CARE FACTORS FREQUENCY  PT (By licensed PT), OT (By licensed OT)     PT Frequency: 5x week OT Frequency: 5x week             Contractures Contractures Info: Not present    Additional Factors Info  Code Status, Allergies Code Status Info: full Allergies Info: NKA           Current Medications (09/28/2022):  This is the current hospital active medication list Current Facility-Administered Medications  Medication Dose Route Frequency Provider Last Rate Last Admin   acetaminophen (TYLENOL) tablet 1,000 mg  1,000 mg Oral TID Joen Laura, MD   1,000 mg at 09/28/22 1002   alum & mag hydroxide-simeth (MAALOX/MYLANTA) 200-200-20 MG/5ML suspension 30 mL  30 mL Oral Q4H PRN Alberteen Sam, MD   30 mL at 09/27/22 2331   enoxaparin (LOVENOX) injection 40 mg  40 mg Subcutaneous Q24H Joen Laura, MD   40 mg at 09/28/22 0839   morphine (PF) 2 MG/ML injection 2 mg  2 mg Intravenous Q3H PRN Joen Laura, MD   2 mg at 09/28/22 1412   ondansetron (ZOFRAN) injection 4 mg  4 mg Intravenous Q6H PRN Joen Laura, MD   4 mg at 09/27/22 1027   oxyCODONE (Oxy IR/ROXICODONE) immediate release tablet 5 mg  5 mg Oral Q4H PRN Joen Laura, MD   5 mg at 09/28/22 1001   polyethylene glycol (MIRALAX / GLYCOLAX) packet 17 g  17 g Oral Daily Joen Laura, MD   17 g at 09/28/22 2536   polyvinyl alcohol (LIQUIFILM TEARS) 1.4 % ophthalmic solution 1 drop  1 drop Both Eyes PRN Joen Laura  P, MD   1 drop at 09/27/22 2330   senna-docusate (Senokot-S) tablet 1 tablet  1 tablet Oral Daily Joen Laura, MD   1 tablet at 09/28/22 6578     Discharge Medications: Please see discharge summary for a list of discharge medications.  Relevant Imaging Results:  Relevant Lab Results:   Additional Information SSN: [id number]  Lorri Frederick, LCSW

## 2022-09-28 NOTE — Plan of Care (Signed)
Pt with moderate to severe pain on surgical site. Ice packs provided and pain medicines ordered were given with minimal relief.   Problem: Education: Goal: Knowledge of General Education information will improve Description: Including pain rating scale, medication(s)/side effects and non-pharmacologic comfort measures Outcome: Progressing   Problem: Health Behavior/Discharge Planning: Goal: Ability to manage health-related needs will improve Outcome: Progressing   Problem: Clinical Measurements: Goal: Ability to maintain clinical measurements within normal limits will improve Outcome: Progressing   Problem: Coping: Goal: Level of anxiety will decrease Outcome: Progressing   Problem: Pain Managment: Goal: General experience of comfort will improve Outcome: Progressing   Problem: Safety: Goal: Ability to remain free from injury will improve Outcome: Progressing

## 2022-09-28 NOTE — Evaluation (Signed)
Physical Therapy Evaluation Patient Details Name: Frank West MRN: [id number] DOB: [birth year] Today's Date: 09/28/2022  History of Present Illness  Admitted after mechaical fall resulting in R hip fx, s/p THA for surgical fixation, WBAT with no formal hip precautions;  has a past medical history of Alzheimer disease (HCC) and CAD (coronary artery disease).  Clinical Impression   Pt s/p above diagnosis. Pt in good spirits, motivated to participate. Pt lives at home with daughter in law, PLOF independent, verbal cues for daily routine, daughter states poor overall hygiene even with cueing. Motivated to get up and try walking today; Pt currently significantly limited with mobility due to RLE pain, Mod assist to get up fully to sitting, max-total A and significant pain with bed mobility; attempted standing with Max/total assist, and pt cleared hips from EOB, but unable to get to fully upright standing; Anticipate solid progress with mobility wen his pain is under control, especially considering he was independent with amb and mobility prior to this fall; Patient will benefit from continued inpatient follow up therapy, <3 hours/day        If plan is discharge home, recommend the following: Two people to help with walking and/or transfers   Can travel by private vehicle   No    Equipment Recommendations Rolling walker (2 wheels);BSC/3in1  Recommendations for Other Services       Functional Status Assessment Patient has had a recent decline in their functional status and/or demonstrates limited ability to make significant improvements in function in a reasonable and predictable amount of time     Precautions / Restrictions Precautions Precautions: Fall Restrictions Weight Bearing Restrictions: Yes RLE Weight Bearing: Weight bearing as tolerated      Mobility  Bed Mobility Overal bed mobility: Needs Assistance Bed Mobility: Supine to Sit, Sit to Supine     Supine to sit: Mod  assist Sit to supine: Max assist, +2 for physical assistance   General bed mobility comments: significant pain with mobility, not able to scoot or actively assist with RLE; Pt wanted to try and get up without assist, and was able to get to half-sit with heavy use of bedrails; mod assist and use of bed pad to square off hips at EOB    Transfers Overall transfer level: Needs assistance Equipment used: Rolling walker (2 wheels) Transfers: Sit to/from Stand Sit to Stand: Max assist           General transfer comment: Able to initiate, and cleared hips from EOB briefly with Max assist and heavy weigh shifting onto LLE; unable to extend hips and  trunk for fully upright standing    Ambulation/Gait                  Stairs            Wheelchair Mobility     Tilt Bed    Modified Rankin (Stroke Patients Only)       Balance Overall balance assessment: Needs assistance Sitting-balance support: Feet supported Sitting balance-Leahy Scale: Poor Sitting balance - Comments: EOB   Standing balance support: During functional activity, Reliant on assistive device for balance, Bilateral upper extremity supported Standing balance-Leahy Scale: Poor Standing balance comment: able to partially stand with max A x2 up to 20 seconds                             Pertinent Vitals/Pain Pain Assessment Pain Assessment: Faces Faces Pain Scale: Hurts  whole lot Pain Location: R hip Pain Descriptors / Indicators: Discomfort, Grimacing, Guarding Pain Intervention(s): Premedicated before session, Monitored during session    Home Living Family/patient expects to be discharged to:: Private residence Living Arrangements: Children Available Help at Discharge: Family Type of Home: House Home Access: Stairs to enter Entrance Stairs-Rails: Can reach both Entrance Stairs-Number of Steps: 2   Home Layout: One level Home Equipment: Agricultural consultant (2 wheels) Additional Comments:  Pt lives at home with stepdaughter, has family to assist during the day    Prior Function Prior Level of Function : Independent/Modified Independent             Mobility Comments: ind ADLs Comments: cueing for routines     Hand Dominance   Dominant Hand: Right    Extremity/Trunk Assessment   Upper Extremity Assessment Upper Extremity Assessment: Defer to OT evaluation    Lower Extremity Assessment Lower Extremity Assessment: RLE deficits/detail RLE Deficits / Details: movement significantly limited by pain RLE: Unable to fully assess due to pain       Communication   Communication: No difficulties  Cognition Arousal/Alertness: Awake/alert Behavior During Therapy: WFL for tasks assessed/performed Overall Cognitive Status: History of cognitive impairments - at baseline                                          General Comments General comments (skin integrity, edema, etc.): Daughter, [name] present for session    Exercises     Assessment/Plan    PT Assessment Patient needs continued PT services  PT Problem List Decreased strength;Decreased range of motion;Decreased activity tolerance;Decreased balance;Decreased mobility;Decreased coordination;Decreased cognition;Decreased knowledge of use of DME;Decreased safety awareness;Decreased knowledge of precautions;Decreased skin integrity;Pain       PT Treatment Interventions DME instruction;Gait training;Stair training;Functional mobility training;Therapeutic activities;Therapeutic exercise;Balance training;Neuromuscular re-education;Cognitive remediation;Patient/family education    PT Goals (Current goals can be found in the Care Plan section)  Acute Rehab PT Goals Patient Stated Goal: overheard pt say he wanted to "walk to [employer]" PT Goal Formulation: With patient Time For Goal Achievement: 10/12/22 Potential to Achieve Goals: Good    Frequency Min 1X/week     Co-evaluation                AM-PAC PT "6 Clicks" Mobility  Outcome Measure Help needed turning from your back to your side while in a flat bed without using bedrails?: A Lot Help needed moving from lying on your back to sitting on the side of a flat bed without using bedrails?: Total Help needed moving to and from a bed to a chair (including a wheelchair)?: Total Help needed standing up from a chair using your arms (e.g., wheelchair or bedside chair)?: Total Help needed to walk in hospital room?: Total Help needed climbing 3-5 steps with a railing? : Total 6 Click Score: 7    End of Session Equipment Utilized During Treatment: Gait belt Activity Tolerance: Patient limited by pain Patient left: in bed;with call bell/phone within reach;with bed alarm set (bed in semi-chair position) Nurse Communication: Mobility status PT Visit Diagnosis: Unsteadiness on feet (R26.81);Other abnormalities of gait and mobility (R26.89);History of falling (Z91.81);Pain Pain - Right/Left: Right Pain - part of body: Hip    Time: 1245-1310 PT Time Calculation (min) (ACUTE ONLY): 25 min   Charges:   PT Evaluation $PT Eval Moderate Complexity: 1 Mod PT Treatments $Therapeutic Activity: 8-22 mins  PT General Charges $$ ACUTE PT VISIT: 1 Visit         Van Clines, PT  Acute Rehabilitation Services Office [phone] Secure Chat welcomed   Levi Aland 09/28/2022, 3:22 PM

## 2022-09-28 NOTE — Anesthesia Postprocedure Evaluation (Signed)
Anesthesia Post Note  Patient: Frank West  Procedure(s) Performed: POSTERIOR HIP HEMIATHROPLASTY (Right: Hip)     Patient location during evaluation: PACU Anesthesia Type: MAC and Spinal Level of consciousness: awake and alert Pain management: pain level controlled Vital Signs Assessment: post-procedure vital signs reviewed and stable Respiratory status: spontaneous breathing, nonlabored ventilation, respiratory function stable and patient connected to nasal cannula oxygen Cardiovascular status: stable and blood pressure returned to baseline Postop Assessment: no apparent nausea or vomiting Anesthetic complications: no   No notable events documented.  Last Vitals:  Vitals:   09/27/22 1930 09/27/22 1944  BP: 128/87 (!) 127/99  Pulse: 98 99  Resp: (!) 22 17  Temp: 37.2 C 36.7 C  SpO2: 96% 91%    Last Pain:  Vitals:   09/28/22 0043  TempSrc:   PainSc: 8                  Frank West

## 2022-09-28 NOTE — Progress Notes (Signed)
     Subjective: Patient reports pain as mild.  Hgb 15.9 this AM. Plan for PT today. No acute issues.  Objective:   VITALS:   Vitals:   09/27/22 1900 09/27/22 1915 09/27/22 1930 09/27/22 1944  BP: 111/82 121/88 128/87 (!) 127/99  Pulse: 100 100 98 99  Resp: 17 20 (!) 22 17  Temp:   98.9 F (37.2 C) 98 F (36.7 C)  TempSrc:      SpO2: 95% 95% 96% 91%  Weight:      Height:        Sensation intact distally Intact pulses distally Dorsiflexion/Plantar flexion intact Incision: dressing C/D/I Compartment soft    Lab Results  Component Value Date   WBC 15.8 (H) 09/28/2022   HGB 15.9 09/28/2022   HCT 48.2 09/28/2022   MCV 93.8 09/28/2022   PLT 170 09/28/2022   BMET    Component Value Date/Time   NA 135 09/28/2022 0230   K 3.7 09/28/2022 0230   CL 98 09/28/2022 0230   CO2 24 09/28/2022 0230   GLUCOSE 149 (H) 09/28/2022 0230   BUN 23 09/28/2022 0230   CREATININE 1.44 (H) 09/28/2022 0230   CALCIUM 8.7 (L) 09/28/2022 0230   GFRNONAA 48 (L) 09/28/2022 0230    Xray: cemented hemi components in good position, no adverse features  Assessment/Plan: 1 Day Post-Op   Principal Problem:   Closed subcapital fracture of neck of right femur (HCC) Active Problems:   Alzheimer's dementia (HCC)   Fall at home, initial encounter   Thrombocytopenia (HCC)   Transaminitis   Coronary artery disease without angina pectoris  S/p R hip hemi for FN fx 09/27/22  Post op recs: WB: WBAT with no formal hip precautions Abx: ancef x23 hours post op Imaging: PACU xrays Dressing: Aquacel dressing to be kept intact until follow-up DVT prophylaxis: lovenox starting POD1, dc on aspirin 81mg  BID x4 weeks Follow up: 2 weeks after surgery for a wound check with Dr. Blanchie Dessert at [facility].  Address: [address], [place], [place] [address]  Office Phone: [phone]    Frank West 09/28/2022, 6:35 AM   Weber Cooks, MD  Contact information:    [phone] 7am-5pm epic message Dr. Blanchie Dessert, or call office for patient follow up: [phone] After hours and holidays please check [email or web address] for group call information for [facility]

## 2022-09-28 NOTE — Evaluation (Signed)
Occupational Therapy Evaluation Patient Details Name: Frank West MRN: [id number] DOB: [birth year] Today's Date: 09/28/2022   History of Present Illness Admitted after mechaical fall resulting in R hip fx, s/p THA for surgical fixation, WBAT with no formal hip precautions;  has a past medical history of Alzheimer disease (HCC) and CAD (coronary artery disease).   Clinical Impression   Pt s/p above diagnosis. Pt in good spirits, motivated to participate. Pt lives at home with daughter in law, PLOF independent, verbal cues for daily routine, daughter states poor overall hygiene even with cueing. Pt currently significantly limited with ADLs/mobility due to RLE pain, max-total A and significant pain with bed mobility, total for LB  dressing, not able to stand fully erect at bedside using RW limiting transfers. Pt would benefit greatly from post acute therapy <3hrs/day to improve to functional level prior to return home, will be seen acutely to improve functional strength/independence.      Recommendations for follow up therapy are one component of a multi-disciplinary discharge planning process, led by the attending physician.  Recommendations may be updated based on patient status, additional functional criteria and insurance authorization.   Assistance Recommended at Discharge Frequent or constant Supervision/Assistance  Patient can return home with the following A lot of help with walking and/or transfers;A lot of help with bathing/dressing/bathroom;Assistance with feeding;Assist for transportation;Help with stairs or ramp for entrance    Functional Status Assessment  Patient has had a recent decline in their functional status and demonstrates the ability to make significant improvements in function in a reasonable and predictable amount of time.  Equipment Recommendations  Other (comment) (defer)    Recommendations for Other Services       Precautions / Restrictions  Precautions Precautions: Fall Restrictions Weight Bearing Restrictions: Yes RLE Weight Bearing: Weight bearing as tolerated      Mobility Bed Mobility Overal bed mobility: Needs Assistance Bed Mobility: Supine to Sit, Sit to Supine     Supine to sit: Max assist, +2 for physical assistance, HOB elevated Sit to supine: Max assist, +2 for physical assistance   General bed mobility comments: significant pain with mobility, not able to scoot or actively assist with RLE    Transfers Overall transfer level: Needs assistance                 General transfer comment: not able to stand fully to complete transfer at this time      Balance Overall balance assessment: Needs assistance Sitting-balance support: Feet supported Sitting balance-Leahy Scale: Poor Sitting balance - Comments: EOB   Standing balance support: During functional activity, Reliant on assistive device for balance, Bilateral upper extremity supported Standing balance-Leahy Scale: Poor Standing balance comment: able to partially stand with max A x2 up to 20 seconds                           ADL either performed or assessed with clinical judgement   ADL Overall ADL's : Needs assistance/impaired Eating/Feeding: Independent   Grooming: Set up;Sitting   Upper Body Bathing: Minimal assistance   Lower Body Bathing: Maximal assistance;Sitting/lateral leans   Upper Body Dressing : Set up;Sitting   Lower Body Dressing: Total assistance;Sitting/lateral leans   Toilet Transfer: Total assistance;+2 for physical assistance;BSC/3in1;Rolling walker (2 wheels)   Toileting- Clothing Manipulation and Hygiene: Maximal assistance;Sitting/lateral lean         General ADL Comments: Pt requires significant assistance for LB ADLs, max A x2 STS  today, not able to take steps or pivot transfer, max A to total for bed mobility     Vision Baseline Vision/History: 1 Wears glasses Ability to See in Adequate  Light: 0 Adequate Patient Visual Report: No change from baseline       Perception     Praxis      Pertinent Vitals/Pain Pain Assessment Pain Assessment: 0-10 Pain Score: 8  Pain Location: R hip Pain Descriptors / Indicators: Discomfort, Grimacing, Guarding Pain Intervention(s): Monitored during session     Hand Dominance Right   Extremity/Trunk Assessment Upper Extremity Assessment Upper Extremity Assessment: Overall WFL for tasks assessed           Communication Communication Communication: No difficulties   Cognition Arousal/Alertness: Awake/alert Behavior During Therapy: WFL for tasks assessed/performed Overall Cognitive Status: History of cognitive impairments - at baseline                                 General Comments: Daughter in law states memory deficits at baseline, A/O x2, disoriented to date/location     General Comments       Exercises     Shoulder Instructions      Home Living Family/patient expects to be discharged to:: Private residence Living Arrangements: Children Available Help at Discharge: Family Type of Home: House Home Access: Stairs to enter Secretary/administrator of Steps: 2 Entrance Stairs-Rails: Can reach both Home Layout: One level     Bathroom Shower/Tub: Tub/shower unit;Walk-in shower   Bathroom Toilet: Standard     Home Equipment: Agricultural consultant (2 wheels)   Additional Comments: Pt lives at home with stepdaughter, has family to assist during the day      Prior Functioning/Environment Prior Level of Function : Independent/Modified Independent             Mobility Comments: ind ADLs Comments: cueing for routines        OT Problem List: Decreased strength;Decreased range of motion;Decreased activity tolerance;Impaired balance (sitting and/or standing);Decreased safety awareness;Pain      OT Treatment/Interventions: Self-care/ADL training;Therapeutic exercise;Energy conservation;DME and/or AE  instruction;Therapeutic activities;Patient/family education    OT Goals(Current goals can be found in the care plan section) Acute Rehab OT Goals Patient Stated Goal: decrease pain, improve standing tolerance OT Goal Formulation: With patient/family Time For Goal Achievement: 10/12/22 Potential to Achieve Goals: Good  OT Frequency: Min 1X/week    Co-evaluation              AM-PAC OT "6 Clicks" Daily Activity     Outcome Measure Help from another person eating meals?: None Help from another person taking care of personal grooming?: A Little Help from another person toileting, which includes using toliet, bedpan, or urinal?: A Lot Help from another person bathing (including washing, rinsing, drying)?: A Lot Help from another person to put on and taking off regular upper body clothing?: A Lot Help from another person to put on and taking off regular lower body clothing?: Total 6 Click Score: 14   End of Session Equipment Utilized During Treatment: Gait belt;Rolling walker (2 wheels) Nurse Communication: Mobility status  Activity Tolerance: Patient tolerated treatment well;Patient limited by pain Patient left: in bed;with call bell/phone within reach;with family/visitor present  OT Visit Diagnosis: Unsteadiness on feet (R26.81);Other abnormalities of gait and mobility (R26.89);Muscle weakness (generalized) (M62.81);History of falling (Z91.81);Pain Pain - Right/Left: Right Pain - part of body: Leg  Time: 0630-1601 OT Time Calculation (min): 32 min Charges:  OT General Charges $OT Visit: 1 Visit OT Evaluation $OT Eval Moderate Complexity: 1 Mod OT Treatments $Self Care/Home Management : 8-22 mins  [address], OTR/L   Alexis Goodell 09/28/2022, 3:09 PM

## 2022-09-28 NOTE — Progress Notes (Signed)
NSG 0700-1900: Pt with hx of dementia, confused at baseline. Bed alarm on.  Noted to have decreased UO, amber in color.   Pt encouraged to drink fluids throughout the shift.  Pt states he does not like water.  Pt given iced tea and shasta.  Family at the bedside visiting, updated on POC.  Medicated with PRN pain medication.  Pt assisted with set up for meals.  PT following.  WCTM
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
## 2022-09-29 ENCOUNTER — Encounter (HOSPITAL_COMMUNITY): Payer: Self-pay | Admitting: Orthopedic Surgery

## 2022-09-29 DIAGNOSIS — G309 Alzheimer's disease, unspecified: Secondary | ICD-10-CM | POA: Diagnosis not present

## 2022-09-29 DIAGNOSIS — I2510 Atherosclerotic heart disease of native coronary artery without angina pectoris: Secondary | ICD-10-CM | POA: Diagnosis not present

## 2022-09-29 DIAGNOSIS — W19XXXA Unspecified fall, initial encounter: Secondary | ICD-10-CM | POA: Diagnosis not present

## 2022-09-29 DIAGNOSIS — S72011A Unspecified intracapsular fracture of right femur, initial encounter for closed fracture: Secondary | ICD-10-CM | POA: Diagnosis not present

## 2022-09-29 MED ORDER — ASPIRIN 81 MG PO TBEC: 81.0000 mg | DELAYED_RELEASE_TABLET | Freq: Two times a day (BID) | ORAL | 0 refills | Status: AC

## 2022-09-29 MED ORDER — HYDROCODONE-ACETAMINOPHEN 5-325 MG PO TABS: 1.0000 | ORAL_TABLET | ORAL | 0 refills | Status: DC | PRN

## 2022-09-29 MED ORDER — SENNOSIDES-DOCUSATE SODIUM 8.6-50 MG PO TABS: 1.0000 | ORAL_TABLET | Freq: Every day | ORAL | Status: DC

## 2022-09-29 MED ORDER — CEFADROXIL 500 MG PO CAPS: 500.0000 mg | ORAL_CAPSULE | Freq: Two times a day (BID) | ORAL | 0 refills | Status: AC

## 2022-09-29 MED ORDER — POLYETHYLENE GLYCOL 3350 17 G PO PACK: 17.0000 g | PACK | Freq: Every day | ORAL | 0 refills | Status: DC

## 2022-09-29 NOTE — TOC Progression Note (Signed)
Transition of Care [facility]) - Progression Note    Patient Details  Name: Frank West MRN: [id number] Date of Birth: 1935-10-13  Transition of Care [facility]) CM/SW Contact  Lorri Frederick, LCSW Phone Number: 09/29/2022, 9:45 AM  Clinical Narrative:    [facility].  She asked CSW to convey to daughter that they are not allowed to use bed alarms and also that they do have several covid cases in the building.  They can receive pt today.  MD informed.   0940: TC daughter [name].  Discussed the above, she does want to accept offer at [facility].    Expected Discharge Plan: Skilled Nursing Facility Barriers to Discharge: Continued Medical Work up  Expected Discharge Plan and Services In-house Referral: Clinical Social Work   Post Acute Care Choice: Skilled Nursing Facility Living arrangements for the past 2 months: Single Family Home Expected Discharge Date: 09/29/22                                     Social Determinants of Health (SDOH) Interventions SDOH Screenings   Food Insecurity: No Food Insecurity (09/23/2022)  Housing: Low Risk  (09/23/2022)  Transportation Needs: No Transportation Needs (09/23/2022)  Utilities: Not At Risk (09/23/2022)  Tobacco Use: Medium Risk (09/27/2022)    Readmission Risk Interventions     No data to display
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
## 2022-09-29 NOTE — Progress Notes (Signed)
    2 Days Post-Op Procedure(s) (LRB): POSTERIOR HIP HEMIATHROPLASTY (Right)  Subjective: Patient reports pain as mild.  Slept well.  Feels "alright".  Hgb 15.5 this AM. Max assist with PT/OT yesterday, appears motivated.  No acute issues.  Hx of mild dementia and some confusion at baseline.  Objective:   VITALS:   Vitals:   09/28/22 0838 09/28/22 1518 09/28/22 1938 09/29/22 0410  BP: 128/86 129/81 (!) 141/90 (!) 157/98  Pulse: 99 (!) 105 (!) 101   Resp:   19 18  Temp:  97.6 F (36.4 C) 98.2 F (36.8 C) 97.9 F (36.6 C)  TempSrc:  Oral  Oral  SpO2:  94% 97% 98%  Weight:      Height:        Alert, oriented to self, place, event Sensation intact distally Intact pulses distally Dorsiflexion/Plantar flexion intact Incision: dressing off, laying next to patient.  Mild pink hue near incision, skin appears irritated, no erythema or drainage.  Incision and sutures intact, glue intact. Compartment soft Wiggles toes appropriately   Lab Results  Component Value Date   WBC 15.5 (H) 09/29/2022   HGB 13.9 09/29/2022   HCT 42.7 09/29/2022   MCV 91.8 09/29/2022   PLT 145 (L) 09/29/2022   BMET    Component Value Date/Time   NA 133 (L) 09/29/2022 0143   K 4.1 09/29/2022 0143   CL 98 09/29/2022 0143   CO2 24 09/29/2022 0143   GLUCOSE 138 (H) 09/29/2022 0143   BUN 25 (H) 09/29/2022 0143   CREATININE 1.24 09/29/2022 0143   CALCIUM 8.6 (L) 09/29/2022 0143   GFRNONAA 57 (L) 09/29/2022 0143    Xray: stable post-op imaging  Assessment/Plan: 2 Days Post-Op   Principal Problem:   Closed subcapital fracture of neck of right femur [facility]) Active Problems:   Alzheimer's dementia (HCC)   Fall at home, initial encounter   Thrombocytopenia (HCC)   Transaminitis   Coronary artery disease without angina pectoris  S/p R hip hemi for FN fx 09/27/22  -- Bandage changed with assistance from nursing staff.  Mepilex placed with tegaderm added for support.  Due to exposure of incision  from patient repeatedly removing bandage and clinical exam findings, recommend short course of PO antibiotic for prophylaxis.  Continue with follow up as planned.  Post op recs: WB: WBAT with no formal hip precautions Abx: ancef x23 hours post op Imaging: PACU xrays Dressing: Mepilex dressing to be kept intact until follow-up, only change if soiled/needed DVT prophylaxis: lovenox starting POD1, dc on aspirin 81mg  BID x4 weeks DC: plan for SNF Follow up: 2 weeks after surgery for a wound check with Dr. Blanchie Dessert at [facility].  Address: [address], [place], [place] [address]  Office Phone: [phone]    Frank West 09/29/2022, 8:56 AM   Weber Cooks, MD  Contact information:   [phone] 7am-5pm epic message Dr. Blanchie Dessert, or call office for patient follow up: [phone] After hours and holidays please check [email or web address] for group call information for [facility]

## 2022-09-29 NOTE — TOC Transition Note (Signed)
Transition of Care [facility]) - CM/SW Discharge Note   Patient Details  Name: Frank West MRN: [id number] Date of Birth: 1935-07-24  Transition of [facility]) CM/SW Contact:  Lorri Frederick, LCSW Phone Number: 09/29/2022, 1:18 PM   Clinical Narrative:   Pt discharging to [facility].  RN call report to [phone].    Final next level of care: Skilled Nursing Facility Barriers to Discharge: Barriers Resolved   Patient Goals and CMS Choice   Choice offered to / list presented to : Adult Children  Discharge Placement                Patient [facility] bed at:  [facility]) Patient to be transferred to facility by: [facility] Name of family member notified: daughter [name] in room Patient and family notified of of transfer: 09/29/22  Discharge Plan and Services Additional resources added to the After Visit Summary for   In-house Referral: Clinical Social Work   Post Acute Care Choice: Skilled Nursing Facility                               Social Determinants of Health (SDOH) Interventions SDOH Screenings   Food Insecurity: No Food Insecurity (09/23/2022)  Housing: Low Risk  (09/23/2022)  Transportation Needs: No Transportation Needs (09/23/2022)  Utilities: Not At Risk (09/23/2022)  Tobacco Use: Medium Risk (09/27/2022)     Readmission Risk Interventions     No data to display

## 2022-09-29 NOTE — Plan of Care (Signed)
Overnight confusion noted. Pt ripped off IV, pulled out his condom cath and tried to get out of the bed. Frequent reorientation and redirection provided. Pt c/o moderate to severe pain on right hip.  Problem: Education: Goal: Knowledge of General Education information will improve Description: Including pain rating scale, medication(s)/side effects and non-pharmacologic comfort measures Outcome: Progressing   Problem: Health Behavior/Discharge Planning: Goal: Ability to manage health-related needs will improve Outcome: Progressing   Problem: Activity: Goal: Risk for activity intolerance will decrease Outcome: Not Progressing   Problem: Nutrition: Goal: Adequate nutrition will be maintained Outcome: Progressing   Problem: Coping: Goal: Level of anxiety will decrease Outcome: Progressing   Problem: Pain Managment: Goal: General experience of comfort will improve Outcome: Progressing   Problem: Safety: Goal: Ability to remain free from injury will improve Outcome: Progressing

## 2022-09-29 NOTE — Discharge Planning (Signed)
Patient alert. IV access removed. Report called to [place] at [facility]. Patient's daughters aware of plan and transport via [facility] to [facility].

## 2022-09-29 NOTE — Progress Notes (Signed)
Physical Therapy Treatment Patient Details Name: Frank West MRN: [id number] DOB: 03-30-1935 Today's Date: 09/29/2022   History of Present Illness Admitted after mechaical fall resulting in R hip fx, s/p THA for surgical fixation, WBAT with no formal hip precautions;  has a past medical history of Alzheimer disease (HCC) and CAD (coronary artery disease).    PT Comments  Continuing work on functional mobility and activity tolerance;  Session focused on functional transfers, with excellent improvements noted; Light mod assist to sit up to EOB, much smoother motion, and MUCH more comfortable with intentional premedication for pain; Stood from EOB with 2 person mod assist, and stood long enough to try using the urinal; reported nausea with standing this long, and opted to sit; nausea resolved once sitting -- this PT is curious about orthostatic hypotension;   Pt's daughter, Frank West present, and reported Frank West occasionally will ask for a chair because he feels like he might "pass out" -- possible that pt has been experiencing orthostasis for some time; perhaps contributes to etiology of this fall?  Pt became less responsive as we settled him down in the recliner at end of session, also with 2 reports of "heartburn" during session; Reclined to near supine, called RN, who came promptly; took BPs and started O2 and HR monitor; RN contacted Dr. Maryfrances Bunnell;   Will ask Mobility Team about getting a set of orthostatic vitals later today      If plan is discharge home, recommend the following: Two people to help with walking and/or transfers   Can travel by private vehicle     No  Equipment Recommendations  Rolling walker (2 wheels);BSC/3in1    Recommendations for Other Services       Precautions / Restrictions Precautions Precautions: Fall Restrictions RLE Weight Bearing: Weight bearing as tolerated     Mobility  Bed Mobility Overal bed mobility: Needs Assistance Bed Mobility: Supine to  Sit     Supine to sit: Mod assist, +2 for safety/equipment     General bed mobility comments: Much better tolerance of R hip movement today; mod assist to square off hips at EOB; second person helpful for support    Transfers Overall transfer level: Needs assistance Equipment used: Rolling walker (2 wheels) Transfers: Sit to/from Stand, Bed to chair/wheelchair/BSC Sit to Stand: +2 physical assistance, Mod assist Stand pivot transfers: Mod assist, +2 safety/equipment         General transfer comment: Good forward weight shift to initiate; bilateral mod assist to support and power up; stood at least 3-5 minutes, also with assist to use teh urinal (condom cath had come off); Pt reported nausea, and we opted to sit instead of trying amb; after settled sitting EOB (nausea subsided in sitting), performed stand pivot transfer with 2 person mod assist to recliner on pt's L side    Ambulation/Gait                   Stairs             Wheelchair Mobility     Tilt Bed    Modified Rankin (Stroke Patients Only)       Balance     Sitting balance-Leahy Scale: Fair       Standing balance-Leahy Scale: Poor                              Cognition Arousal/Alertness: Awake/alert Behavior During Therapy: WFL for tasks assessed/performed Overall  Cognitive Status: History of cognitive impairments - at baseline                                          Exercises      General Comments General comments (skin integrity, edema, etc.): Pt became less responsive as we settled him down in the recliner at end of session, also with 2 reports of "heartburn" during session; Reclined to near supine, called RN, who came promptly; took BPs and started O2 and HR monitor      Pertinent Vitals/Pain Pain Assessment Pain Assessment: Faces Faces Pain Scale: Hurts little more Pain Location: R hip Pain Descriptors / Indicators: Discomfort, Grimacing,  Guarding Pain Intervention(s): Premedicated before session    Home Living                          Prior Function            PT Goals (current goals can now be found in the care plan section) Acute Rehab PT Goals Patient Stated Goal: Wants to get better PT Goal Formulation: With patient Time For Goal Achievement: 10/12/22 Potential to Achieve Goals: Good Progress towards PT goals: Progressing toward goals    Frequency    Min 1X/week      PT Plan Current plan remains appropriate    Co-evaluation              AM-PAC PT "6 Clicks" Mobility   Outcome Measure  Help needed turning from your back to your side while in a flat bed without using bedrails?: A Little Help needed moving from lying on your back to sitting on the side of a flat bed without using bedrails?: A Lot Help needed moving to and from a bed to a chair (including a wheelchair)?: A Lot Help needed standing up from a chair using your arms (e.g., wheelchair or bedside chair)?: A Lot Help needed to walk in hospital room?: Total Help needed climbing 3-5 steps with a railing? : Total 6 Click Score: 11    End of Session Equipment Utilized During Treatment: Gait belt Activity Tolerance: Patient limited by fatigue Patient left: in chair;with call bell/phone within reach;with chair alarm set Nurse Communication: Mobility status;Other (comment) (possible orhtostatic symptoms, reports of heartburn) PT Visit Diagnosis: Unsteadiness on feet (R26.81);Other abnormalities of gait and mobility (R26.89);History of falling (Z91.81);Pain Pain - Right/Left: Right Pain - part of body: Hip     Time: 9604-5409 PT Time Calculation (min) (ACUTE ONLY): 47 min  Charges:    $Therapeutic Activity: 38-52 mins PT General Charges $$ ACUTE PT VISIT: 1 Visit                     Van Clines, PT  Acute Rehabilitation Services Office [phone] Secure Chat welcomed    Levi Aland 09/29/2022, 12:49 PM

## 2022-09-30 ENCOUNTER — Non-Acute Institutional Stay (SKILLED_NURSING_FACILITY): Payer: Medicare Other | Admitting: Adult Health

## 2022-09-30 ENCOUNTER — Encounter: Payer: Self-pay | Admitting: Adult Health

## 2022-09-30 DIAGNOSIS — I2510 Atherosclerotic heart disease of native coronary artery without angina pectoris: Secondary | ICD-10-CM | POA: Diagnosis not present

## 2022-09-30 DIAGNOSIS — K5909 Other constipation: Secondary | ICD-10-CM

## 2022-09-30 DIAGNOSIS — S72011S Unspecified intracapsular fracture of right femur, sequela: Secondary | ICD-10-CM

## 2022-09-30 DIAGNOSIS — D696 Thrombocytopenia, unspecified: Secondary | ICD-10-CM | POA: Diagnosis not present

## 2022-09-30 DIAGNOSIS — R7401 Elevation of levels of liver transaminase levels: Secondary | ICD-10-CM

## 2022-09-30 DIAGNOSIS — G301 Alzheimer's disease with late onset: Secondary | ICD-10-CM

## 2022-09-30 DIAGNOSIS — F02C0 Dementia in other diseases classified elsewhere, severe, without behavioral disturbance, psychotic disturbance, mood disturbance, and anxiety: Secondary | ICD-10-CM

## 2022-09-30 NOTE — Progress Notes (Signed)
Location:  [facility] Room Number: 129 Place of Service:  SNF (31)   CODE STATUS: full   No Known Allergies  Chief Complaint  Patient presents with   Hospitalization Follow-up    HPI:  He is a 87 year old man who has been hospitalized from 09-23-22 through 09-29-22. His past medical history includes: alzheimer's dementia; thrombocytopenia; CAD. He presented to the ED after a fall and suffered a right hip fracture. On 09-27-22 he underwent a right hip hemiarthroplasty. He is here for short term rehab with his goal to return home to family. He tells me that he is wanting to go to bed. There are no indications of pain present. There are no reports anxiety or agitation. He will continue to be followed for his chronic illnesses including:  Coronary artery disease involving native coronary artery of native heart without angina pectoris:     Severe late onset alzheimer's unspecified whether behavioral psychosis mood disturbance or anxiety:    Thrombocytopenia     Past Medical History:  Diagnosis Date   Alzheimer disease (HCC)    CAD (coronary artery disease)    a. s/p prior stent in 2017 but details unavailable    Past Surgical History:  Procedure Laterality Date   CORONARY ANGIOPLASTY WITH STENT PLACEMENT     HIP ARTHROPLASTY Right 09/27/2022   Procedure: POSTERIOR HIP HEMIATHROPLASTY;  Surgeon: Joen Laura, MD;  Location: [facility] OR;  Service: Orthopedics;  Laterality: Right;   IR TRANSCATHETER BX  2007   done in [place]    uroscopy  2011    Social History   Socioeconomic History   Marital status: Widowed    Spouse name: Not on file   Number of children: Not on file   Years of education: Not on file   Highest education level: Not on file  Occupational History   Not on file  Tobacco Use   Smoking status: Former   Smokeless tobacco: Never  Substance and Sexual Activity   Alcohol use: Never   Drug use: Never   Sexual activity: Not on file  Other  Topics Concern   Not on file  Social History Narrative   Not on file   Social Determinants of Health   Financial Resource Strain: Not on file  Food Insecurity: No Food Insecurity (09/23/2022)   Hunger Vital Sign    Worried About Running Out of Food in the Last Year: Never true    Ran Out of Food in the Last Year: Never true  Transportation Needs: No Transportation Needs (09/23/2022)   PRAPARE - Administrator, Civil Service (Medical): No    Lack of Transportation (Non-Medical): No  Physical Activity: Not on file  Stress: Not on file  Social Connections: Not on file  Intimate Partner Violence: Not At Risk (09/23/2022)   Humiliation, Afraid, Rape, and Kick questionnaire    Fear of Current or Ex-Partner: No    Emotionally Abused: No    Physically Abused: No    Sexually Abused: No   Family History  Problem Relation Age of Onset   Melanoma Father    Obesity Brother       VITAL SIGNS BP 120/80   Pulse 88   Temp 98.8 F (37.1 C)   Resp 20   Ht 5' 11.5" (1.816 m)   Wt 182 lb 9.6 oz (82.8 kg)   SpO2 96%   BMI 25.11 kg/m   Outpatient Encounter Medications as of 09/30/2022  Medication Sig   HYDROcodone-acetaminophen (NORCO/VICODIN) 5-325 MG tablet Take 1 tablet by mouth every 8 (eight) hours as needed for moderate pain.   aspirin EC 81 MG tablet Take 1 tablet (81 mg total) by mouth 2 (two) times daily for 28 days. Swallow whole.   cefadroxil (DURICEF) 500 MG capsule Take 1 capsule (500 mg total) by mouth 2 (two) times daily for 7 days.   ibuprofen (ADVIL) 200 MG tablet Take 600 mg by mouth every 6 (six) hours as needed for moderate pain.   polyethylene glycol (MIRALAX / GLYCOLAX) 17 g packet Take 17 g by mouth daily.   senna-docusate (SENOKOT-S) 8.6-50 MG tablet Take 1 tablet by mouth daily.   [DISCONTINUED] HYDROcodone-acetaminophen (NORCO/VICODIN) 5-325 MG tablet Take 1 tablet by mouth every 4 (four) hours as needed for up to 7 days for moderate pain. (Patient  taking differently: Take 1 tablet by mouth every 8 (eight) hours as needed for moderate pain.)   No facility-administered encounter medications on file as of 09/30/2022.     SIGNIFICANT DIAGNOSTIC EXAMS  LABS REVIEWED;   09-23-22: wbc 14.8; hgb 16.6; hct 50.4; mcv 93.0 plt 165; glucose 125; bun 15; creat 1.21; k+ 4.1; na++ 136; ca 8.9; gfr 59; ast 63 alt 59; protein 7.8 albumin 3.8 09-24-22: tsh 2.565 09-24-22: chol 179; ldl 120; trig 78; hdl 43 09-29-22: wbc 15.5; hgb 13.9; hct 42.7; mcv 91.8 plt 145; glucose 138; bun 25; creat 1.24; k+ 4.1; na++ 133; ca 8.6; gfr 57   Review of Systems  Constitutional:  Negative for malaise/fatigue.  Respiratory:  Negative for cough and shortness of breath.   Cardiovascular:  Negative for chest pain, palpitations and leg swelling.  Gastrointestinal:  Negative for abdominal pain, constipation and heartburn.  Musculoskeletal:  Positive for joint pain. Negative for back pain and myalgias.  Skin: Negative.   Neurological:  Negative for dizziness.  Psychiatric/Behavioral:  The patient is not nervous/anxious.    Physical Exam Constitutional:      General: He is not in acute distress.    Appearance: He is well-developed. He is not diaphoretic.  Neck:     Thyroid: No thyromegaly.  Cardiovascular:     Rate and Rhythm: Normal rate and regular rhythm.     Heart sounds: Normal heart sounds.  Pulmonary:     Effort: Pulmonary effort is normal. No respiratory distress.     Breath sounds: Normal breath sounds.  Abdominal:     General: Bowel sounds are normal. There is no distension.     Palpations: Abdomen is soft.     Tenderness: There is no abdominal tenderness.  Musculoskeletal:        General: Normal range of motion.     Cervical back: Neck supple.     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  Skin:    General: Skin is warm and dry.   Neurological:     Mental Status: He is alert. Mental status is at baseline.  Psychiatric:        Mood and Affect: Mood normal.       ASSESSMENT/ PLAN:  TODAY  Closed subcapital fracture of right femur sequela: will continue therapy as directed to improve upon his adl care. Will continue asa 81 mg twice daily for 28 days; duricef 500 mg twice daily for 7 days; will continue motrin 600 mg every 6 hours as needed; vicodin 5/325  mg every 8 hours as needed.   2. Coronary artery disease involving native coronary artery of native heart without angina pectoris: is stable  3. Severe late onset alzheimer's unspecified whether behavioral psychosis mood disturbance or anxiety: weight is 182 pounds will monitor   4. Thrombocytopenia: plt 145  5. Transaminitis: without change  6. Chronic constipation: will continue miralax daily; senna s daily    Will check cbc; cmp     Synthia Innocent NP [facility]  call [phone]

## 2022-10-04 ENCOUNTER — Other Ambulatory Visit (HOSPITAL_COMMUNITY)
Admission: RE | Admit: 2022-10-04 | Discharge: 2022-10-04 | Disposition: A | Discharge status: Home / Self Care | Payer: Medicare Other | Source: Skilled Nursing Facility | Attending: Adult Health | Admitting: Adult Health

## 2022-10-04 DIAGNOSIS — R7401 Elevation of levels of liver transaminase levels: Secondary | ICD-10-CM | POA: Insufficient documentation

## 2022-10-04 LAB — CBC
HCT: 41.7 % (ref 39.0–52.0)
Hemoglobin: 13.8 g/dL (ref 13.0–17.0)
MCH: 30.5 pg (ref 26.0–34.0)
MCHC: 33.1 g/dL (ref 30.0–36.0)
MCV: 92.1 fL (ref 80.0–100.0)
Platelets: 243 10*3/uL (ref 150–400)
RBC: 4.53 MIL/uL (ref 4.22–5.81)
RDW: 12.7 % (ref 11.5–15.5)
WBC: 10.4 10*3/uL (ref 4.0–10.5)
nRBC: 0 % (ref 0.0–0.2)

## 2022-10-04 LAB — COMPREHENSIVE METABOLIC PANEL
ALT: 21 U/L (ref 0–44)
AST: 36 U/L (ref 15–41)
Albumin: 2.8 g/dL — ABNORMAL LOW (ref 3.5–5.0)
Alkaline Phosphatase: 60 U/L (ref 38–126)
Anion gap: 6 (ref 5–15)
BUN: 20 mg/dL (ref 8–23)
CO2: 25 mmol/L (ref 22–32)
Calcium: 8.2 mg/dL — ABNORMAL LOW (ref 8.9–10.3)
Chloride: 104 mmol/L (ref 98–111)
Creatinine, Ser: 1.01 mg/dL (ref 0.61–1.24)
GFR, Estimated: >60 mL/min (ref >60)
Glucose, Bld: 111 mg/dL — ABNORMAL HIGH (ref 70–99)
Potassium: 3.6 mmol/L (ref 3.5–5.1)
Sodium: 135 mmol/L (ref 135–145)
Total Bilirubin: 1 mg/dL (ref 0.3–1.2)
Total Protein: 6.3 g/dL — ABNORMAL LOW (ref 6.5–8.1)

## 2022-10-05 ENCOUNTER — Non-Acute Institutional Stay (SKILLED_NURSING_FACILITY): Payer: Medicare Other | Admitting: Internal Medicine

## 2022-10-05 ENCOUNTER — Encounter: Payer: Self-pay | Admitting: Internal Medicine

## 2022-10-05 DIAGNOSIS — R030 Elevated blood-pressure reading, without diagnosis of hypertension: Secondary | ICD-10-CM | POA: Diagnosis not present

## 2022-10-05 DIAGNOSIS — S72011S Unspecified intracapsular fracture of right femur, sequela: Secondary | ICD-10-CM

## 2022-10-05 DIAGNOSIS — G301 Alzheimer's disease with late onset: Secondary | ICD-10-CM

## 2022-10-05 DIAGNOSIS — D696 Thrombocytopenia, unspecified: Secondary | ICD-10-CM | POA: Diagnosis not present

## 2022-10-05 DIAGNOSIS — F02C0 Dementia in other diseases classified elsewhere, severe, without behavioral disturbance, psychotic disturbance, mood disturbance, and anxiety: Secondary | ICD-10-CM

## 2022-10-05 DIAGNOSIS — I2510 Atherosclerotic heart disease of native coronary artery without angina pectoris: Secondary | ICD-10-CM | POA: Diagnosis not present

## 2022-10-05 DIAGNOSIS — R7401 Elevation of levels of liver transaminase levels: Secondary | ICD-10-CM

## 2022-10-05 NOTE — Assessment & Plan Note (Signed)
PT/OT at SNF as tolerated.  Follow-up with Dr. Blanchie Dessert as scheduled.

## 2022-10-05 NOTE — Assessment & Plan Note (Signed)
He denies any anginal equivalent.  He denies any cardiac prodrome prior to his fall and hip fracture.

## 2022-10-05 NOTE — Assessment & Plan Note (Signed)
He gave the date as August 03, 2022.  He confabulated about living with his daughter "since she was a little girl."  He apparently has relocated to this area to live with his adult daughter after [place] extended period of time.

## 2022-10-05 NOTE — Assessment & Plan Note (Signed)
10/04/2022 thrombocytopenia resolved; platelet count 243,000.  Ibuprofen discontinued because of risk of GI bleed.

## 2022-10-05 NOTE — Patient Instructions (Signed)
See assessment and plan under each diagnosis in the problem list and acutely for this visit 
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
## 2022-10-05 NOTE — Assessment & Plan Note (Signed)
10/04/2022 LFTs now normal with an AST of 36 and ALT of 21.  No change indicated.

## 2022-10-06 ENCOUNTER — Other Ambulatory Visit (HOSPITAL_COMMUNITY)
Admission: RE | Admit: 2022-10-06 | Discharge: 2022-10-06 | Disposition: A | Discharge status: Home / Self Care | Payer: Medicare Other | Source: Skilled Nursing Facility | Attending: Internal Medicine | Admitting: Internal Medicine

## 2022-10-06 DIAGNOSIS — I951 Orthostatic hypotension: Secondary | ICD-10-CM | POA: Insufficient documentation

## 2022-10-06 LAB — CBC WITH DIFFERENTIAL/PLATELET
Abs Immature Granulocytes: 0.52 10*3/uL — ABNORMAL HIGH (ref 0.00–0.07)
Basophils Absolute: 0.2 10*3/uL — ABNORMAL HIGH (ref 0.0–0.1)
Basophils Relative: 1 %
Eosinophils Absolute: 0.3 10*3/uL (ref 0.0–0.5)
Eosinophils Relative: 2 %
HCT: 45.1 % (ref 39.0–52.0)
Hemoglobin: 14.6 g/dL (ref 13.0–17.0)
Immature Granulocytes: 4 %
Lymphocytes Relative: 24 %
Lymphs Abs: 3.5 10*3/uL (ref 0.7–4.0)
MCH: 30.9 pg (ref 26.0–34.0)
MCHC: 32.4 g/dL (ref 30.0–36.0)
MCV: 95.3 fL (ref 80.0–100.0)
Monocytes Absolute: 1.4 10*3/uL — ABNORMAL HIGH (ref 0.1–1.0)
Monocytes Relative: 9 %
Neutro Abs: 8.9 10*3/uL — ABNORMAL HIGH (ref 1.7–7.7)
Neutrophils Relative %: 60 %
Platelets: 354 10*3/uL (ref 150–400)
RBC: 4.73 MIL/uL (ref 4.22–5.81)
RDW: 12.6 % (ref 11.5–15.5)
WBC: 14.7 10*3/uL — ABNORMAL HIGH (ref 4.0–10.5)
nRBC: 0 % (ref 0.0–0.2)

## 2022-10-06 LAB — BASIC METABOLIC PANEL
Anion gap: 11 (ref 5–15)
BUN: 21 mg/dL (ref 8–23)
CO2: 24 mmol/L (ref 22–32)
Calcium: 8.9 mg/dL (ref 8.9–10.3)
Chloride: 101 mmol/L (ref 98–111)
Creatinine, Ser: 1.17 mg/dL (ref 0.61–1.24)
GFR, Estimated: >60 mL/min (ref >60)
Glucose, Bld: 115 mg/dL — ABNORMAL HIGH (ref 70–99)
Potassium: 3.8 mmol/L (ref 3.5–5.1)
Sodium: 136 mmol/L (ref 135–145)

## 2022-10-15 ENCOUNTER — Non-Acute Institutional Stay (SKILLED_NURSING_FACILITY): Payer: Medicare Other | Admitting: Adult Health

## 2022-10-15 ENCOUNTER — Encounter: Payer: Self-pay | Admitting: Adult Health

## 2022-10-15 DIAGNOSIS — D696 Thrombocytopenia, unspecified: Secondary | ICD-10-CM | POA: Diagnosis not present

## 2022-10-15 DIAGNOSIS — G301 Alzheimer's disease with late onset: Secondary | ICD-10-CM

## 2022-10-15 DIAGNOSIS — S72011S Unspecified intracapsular fracture of right femur, sequela: Secondary | ICD-10-CM

## 2022-10-15 DIAGNOSIS — F02C0 Dementia in other diseases classified elsewhere, severe, without behavioral disturbance, psychotic disturbance, mood disturbance, and anxiety: Secondary | ICD-10-CM

## 2022-10-15 NOTE — Progress Notes (Signed)
Location:  [facility] Room Number: 129P Place of Service:  SNF (31)   CODE STATUS: Full Code   No Known Allergies  Chief Complaint  Patient presents with   Acute Visit    Patient is being seen for care plan meeting     HPI:  We have come together for his care plan meeting. Family present BIMS 7/15 mood 1/30: decreased energy.  10-08-22 SLUMS 15/30; poor safety awareness. Uses wheelchair no falls. He requires moderate to dependent assist with adl care. He is frequently incontinent of bladder and bowel. Dietary: setup for meals regular diet weight 171.4 pounds appetite fair. Therapy: 300 feet with rolling walker with supervision; upper body supervision lower body contact guard; transfers supervision; BRP supervision stairs 5 steps bilateral hand rails supervision; car transfer supervision .Activities: in room. He continues to be followed for his chronic illnesses including: Closed subcapital fracture of right femur sequela   Late onset severe alzheimer's disease without behavioral disturbance, psychotic or mood disturbance; or mood.     Thrombocytopenia  Past Medical History:  Diagnosis Date   Alzheimer disease (HCC)    CAD (coronary artery disease)    a. s/p prior stent in 2017 but details unavailable    Past Surgical History:  Procedure Laterality Date   CORONARY ANGIOPLASTY WITH STENT PLACEMENT     HIP ARTHROPLASTY Right 09/27/2022   Procedure: POSTERIOR HIP HEMIATHROPLASTY;  Surgeon: Joen Laura, MD;  Location: [facility] OR;  Service: Orthopedics;  Laterality: Right;   IR TRANSCATHETER BX  2007   done in [place]    uroscopy  2011    Social History   Socioeconomic History   Marital status: Widowed    Spouse name: Not on file   Number of children: Not on file   Years of education: Not on file   Highest education level: Not on file  Occupational History   Not on file  Tobacco Use   Smoking status: Former   Smokeless tobacco: Never  Substance and  Sexual Activity   Alcohol use: Never   Drug use: Never   Sexual activity: Not on file  Other Topics Concern   Not on file  Social History Narrative   Not on file   Social Determinants of Health   Financial Resource Strain: Not on file  Food Insecurity: No Food Insecurity (09/23/2022)   Hunger Vital Sign    Worried About Running Out of Food in the Last Year: Never true    Ran Out of Food in the Last Year: Never true  Transportation Needs: No Transportation Needs (09/23/2022)   PRAPARE - Administrator, Civil Service (Medical): No    Lack of Transportation (Non-Medical): No  Physical Activity: Not on file  Stress: Not on file  Social Connections: Not on file  Intimate Partner Violence: Not At Risk (09/23/2022)   Humiliation, Afraid, Rape, and Kick questionnaire    Fear of Current or Ex-Partner: No    Emotionally Abused: No    Physically Abused: No    Sexually Abused: No   Family History  Problem Relation Age of Onset   Melanoma Father    Obesity Brother       VITAL SIGNS BP 130/69   Pulse (!) 52   Temp 97.7 F (36.5 C) (Temporal)   Resp 16   Ht 5' 11.5" (1.816 m)   Wt 171 lb 6.4 oz (77.7 kg)   SpO2 95%   BMI 23.57 kg/m  Outpatient Encounter Medications as of 10/15/2022  Medication Sig Note   aspirin EC 81 MG tablet Take 1 tablet (81 mg total) by mouth 2 (two) times daily for 28 days. Swallow whole.    ibuprofen (ADVIL) 200 MG tablet Take 600 mg by mouth every 6 (six) hours as needed for moderate pain. 10/05/2022: On Beers' List;D/Ced due to GI risk   polyethylene glycol (MIRALAX / GLYCOLAX) 17 g packet Take 17 g by mouth daily.    PROSOURCE PROTEIN PO Take 30 mLs by mouth. 3 times a day    senna-docusate (SENOKOT-S) 8.6-50 MG tablet Take 1 tablet by mouth daily.    No facility-administered encounter medications on file as of 10/15/2022.     SIGNIFICANT DIAGNOSTIC EXAMS   LABS REVIEWED;   09-23-22: wbc 14.8; hgb 16.6; hct 50.4; mcv 93.0 plt 165;  glucose 125; bun 15; creat 1.21; k+ 4.1; na++ 136; ca 8.9; gfr 59; ast 63 alt 59; protein 7.8 albumin 3.8 09-24-22: tsh 2.565 09-24-22: chol 179; ldl 120; trig 78; hdl 43 09-29-22: wbc 15.5; hgb 13.9; hct 42.7; mcv 91.8 plt 145; glucose 138; bun 25; creat 1.24; k+ 4.1; na++ 133; ca 8.6; gfr 57   Review of Systems  Constitutional:  Negative for malaise/fatigue.  Respiratory:  Negative for cough and shortness of breath.   Cardiovascular:  Negative for chest pain, palpitations and leg swelling.  Gastrointestinal:  Negative for abdominal pain, constipation and heartburn.  Musculoskeletal:  Negative for back pain, joint pain and myalgias.  Skin: Negative.   Neurological:  Negative for dizziness.  Psychiatric/Behavioral:  The patient is not nervous/anxious.    Physical Exam Constitutional:      General: He is not in acute distress.    Appearance: He is well-developed. He is not diaphoretic.  Neck:     Thyroid: No thyromegaly.  Cardiovascular:     Rate and Rhythm: Normal rate and regular rhythm.     Pulses: Normal pulses.     Heart sounds: Normal heart sounds.  Pulmonary:     Effort: Pulmonary effort is normal. No respiratory distress.     Breath sounds: Normal breath sounds.  Abdominal:     General: Bowel sounds are normal. There is no distension.     Palpations: Abdomen is soft.     Tenderness: There is no abdominal tenderness.  Musculoskeletal:     Cervical back: Neck supple.     Right lower leg: No edema.     Left lower leg: No edema.     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  Skin:    General: Skin is warm and dry.  Neurological:     Mental Status: He is alert. Mental status is at baseline.  Psychiatric:        Mood and Affect: Mood normal.     ASSESSMENT/ PLAN:  TODAY  Closed subcapital fracture of right femur sequela Late onset severe alzheimer's  disease without behavioral disturbance, psychotic or mood disturbance; or mood.   Thrombocytopenia  Will continue current medications Will continue therapy as directed Will continue to monitor his status.  Goal is to return back home  Will return home in the next week.    Time spent with patient: 40 minutes dicussed goals of care; therapy.    Synthia Innocent NP [facility]   call [phone]

## 2022-10-18 ENCOUNTER — Encounter: Payer: Self-pay | Admitting: Adult Health

## 2022-10-18 ENCOUNTER — Non-Acute Institutional Stay (SKILLED_NURSING_FACILITY): Payer: Medicare Other | Admitting: Adult Health

## 2022-10-18 ENCOUNTER — Other Ambulatory Visit: Payer: Self-pay | Admitting: Adult Health

## 2022-10-18 DIAGNOSIS — G301 Alzheimer's disease with late onset: Secondary | ICD-10-CM | POA: Diagnosis not present

## 2022-10-18 DIAGNOSIS — S72011S Unspecified intracapsular fracture of right femur, sequela: Secondary | ICD-10-CM | POA: Diagnosis not present

## 2022-10-18 DIAGNOSIS — F02C0 Dementia in other diseases classified elsewhere, severe, without behavioral disturbance, psychotic disturbance, mood disturbance, and anxiety: Secondary | ICD-10-CM | POA: Diagnosis not present

## 2022-10-18 DIAGNOSIS — D696 Thrombocytopenia, unspecified: Secondary | ICD-10-CM | POA: Diagnosis not present

## 2022-10-18 NOTE — Progress Notes (Unsigned)
Location:  [facility] Room Number: 129 Place of Service:  SNF (31)   CODE STATUS: full   No Known Allergies  Chief Complaint  Patient presents with   Discharge Note    HPI:  He is being discharged to home with home health for pt/ot. Will not need any dme. He will need his prescription written and will need to follow up with his medical provider. He had been hospitalized for a right hip fracture. He was admitted to this facility for short term rehab. He has participated in both pt and ot. Therapy: ambulates 200 feet with rolling walker and supervision; upper body supervision; lower body contact guard assist. Transfers supervision; poor safety awareness.   Past Medical History:  Diagnosis Date   Alzheimer disease (HCC)    CAD (coronary artery disease)    a. s/p prior stent in 2017 but details unavailable    Past Surgical History:  Procedure Laterality Date   CORONARY ANGIOPLASTY WITH STENT PLACEMENT     HIP ARTHROPLASTY Right 09/27/2022   Procedure: POSTERIOR HIP HEMIATHROPLASTY;  Surgeon: Joen Laura, MD;  Location: [facility] OR;  Service: Orthopedics;  Laterality: Right;   IR TRANSCATHETER BX  2007   done in [place]    uroscopy  2011    Social History   Socioeconomic History   Marital status: Widowed    Spouse name: Not on file   Number of children: Not on file   Years of education: Not on file   Highest education level: Not on file  Occupational History   Not on file  Tobacco Use   Smoking status: Former   Smokeless tobacco: Never  Substance and Sexual Activity   Alcohol use: Never   Drug use: Never   Sexual activity: Not on file  Other Topics Concern   Not on file  Social History Narrative   Not on file   Social Determinants of Health   Financial Resource Strain: Not on file  Food Insecurity: No Food Insecurity (09/23/2022)   Hunger Vital Sign    Worried About Running Out of Food in the Last Year: Never true    Ran Out of Food in  the Last Year: Never true  Transportation Needs: No Transportation Needs (09/23/2022)   PRAPARE - Administrator, Civil Service (Medical): No    Lack of Transportation (Non-Medical): No  Physical Activity: Not on file  Stress: Not on file  Social Connections: Not on file  Intimate Partner Violence: Not At Risk (09/23/2022)   Humiliation, Afraid, Rape, and Kick questionnaire    Fear of Current or Ex-Partner: No    Emotionally Abused: No    Physically Abused: No    Sexually Abused: No   Family History  Problem Relation Age of Onset   Melanoma Father    Obesity Brother       VITAL SIGNS BP 123/70   Pulse 79   Temp (!) 97.4 F (36.3 C)   Resp 20   Ht 5' 11.5" (1.816 m)   Wt 171 lb 6.4 oz (77.7 kg)   SpO2 96%   BMI 23.57 kg/m   Outpatient Encounter Medications as of 10/18/2022  Medication Sig Note   aspirin EC 81 MG tablet Take 1 tablet (81 mg total) by mouth 2 (two) times daily for 28 days. Swallow whole.    ibuprofen (ADVIL) 200 MG tablet Take 600 mg by mouth every 6 (six) hours as needed for moderate pain. 10/05/2022:  On Beers' List;D/Ced due to GI risk   polyethylene glycol (MIRALAX / GLYCOLAX) 17 g packet Take 17 g by mouth daily.    PROSOURCE PROTEIN PO Take 30 mLs by mouth. 3 times a day    senna-docusate (SENOKOT-S) 8.6-50 MG tablet Take 1 tablet by mouth daily.    No facility-administered encounter medications on file as of 10/18/2022.     SIGNIFICANT DIAGNOSTIC EXAMS  LABS REVIEWED;   09-23-22: wbc 14.8; hgb 16.6; hct 50.4; mcv 93.0 plt 165; glucose 125; bun 15; creat 1.21; k+ 4.1; na++ 136; ca 8.9; gfr 59; ast 63 alt 59; protein 7.8 albumin 3.8 09-24-22: tsh 2.565 09-24-22: chol 179; ldl 120; trig 78; hdl 43 09-29-22: wbc 15.5; hgb 13.9; hct 42.7; mcv 91.8 plt 145; glucose 138; bun 25; creat 1.24; k+ 4.1; na++ 133; ca 8.6; gfr 57      ASSESSMENT/ PLAN:   Patient is being discharged with the following home health services:  pt/ot to evaluate and  treat as indicated for gait balance strength adl training.   Patient is being discharged with the following durable medical equipment:  no dme needed   Patient has been advised to f/u with their PCP in 1-2 weeks to for a transitions of care visit.  Social services at their facility was responsible for arranging this appointment.  Pt was provided with adequate prescriptions of noncontrolled medications to reach the scheduled appointment .  For controlled substances, a limited supply was provided as appropriate for the individual patient.  If the pt normally receives these medications from a pain clinic or has a contract with another physician, these medications should be received from that clinic or physician only).    All medications are OTC.   Time spent with patient: 35 minutes: medications; home health; dme.    Synthia Innocent NP [facility]  call [phone]

## 2022-10-26 DIAGNOSIS — G301 Alzheimer's disease with late onset: Secondary | ICD-10-CM | POA: Insufficient documentation

## 2022-10-26 DIAGNOSIS — F02C0 Dementia in other diseases classified elsewhere, severe, without behavioral disturbance, psychotic disturbance, mood disturbance, and anxiety: Secondary | ICD-10-CM | POA: Insufficient documentation

## 2022-10-27 ENCOUNTER — Ambulatory Visit: Payer: Medicare Other | Admitting: Nurse Practitioner

## 2022-12-15 ENCOUNTER — Ambulatory Visit: Payer: Medicare Other | Admitting: Nurse Practitioner

## 2022-12-15 NOTE — Progress Notes (Deleted)
New Patient Office Visit  Subjective    Patient ID: Frank West, male    DOB: 1935-06-03  Age: 87 y.o. MRN: [id number]  CC: No chief complaint on file.   HPI Frank West presents to establish care ***  Outpatient Encounter Medications as of 12/15/2022  Medication Sig   polyethylene glycol (MIRALAX / GLYCOLAX) 17 g packet Take 17 g by mouth daily.   PROSOURCE PROTEIN PO Take 30 mLs by mouth. 3 times a day   senna-docusate (SENOKOT-S) 8.6-50 MG tablet Take 1 tablet by mouth daily.   No facility-administered encounter medications on file as of 12/15/2022.    Past Medical History:  Diagnosis Date   Alzheimer disease (HCC)    CAD (coronary artery disease)    a. s/p prior stent in 2017 but details unavailable    Past Surgical History:  Procedure Laterality Date   CORONARY ANGIOPLASTY WITH STENT PLACEMENT     HIP ARTHROPLASTY Right 09/27/2022   Procedure: POSTERIOR HIP HEMIATHROPLASTY;  Surgeon: Joen Laura, MD;  Location: [facility] OR;  Service: Orthopedics;  Laterality: Right;   IR TRANSCATHETER BX  2007   done in [place]    uroscopy  2011    Family History  Problem Relation Age of Onset   Melanoma Father    Obesity Brother     Social History   Socioeconomic History   Marital status: Widowed    Spouse name: Not on file   Number of children: Not on file   Years of education: Not on file   Highest education level: Not on file  Occupational History   Not on file  Tobacco Use   Smoking status: Former   Smokeless tobacco: Never  Substance and Sexual Activity   Alcohol use: Never   Drug use: Never   Sexual activity: Not on file  Other Topics Concern   Not on file  Social History Narrative   Not on file   Social Determinants of Health   Financial Resource Strain: Not on file  Food Insecurity: No Food Insecurity (09/23/2022)   Hunger Vital Sign    Worried About Running Out of Food in the Last Year: Never true    Ran Out of Food in the Last Year:  Never true  Transportation Needs: No Transportation Needs (09/23/2022)   PRAPARE - Administrator, Civil Service (Medical): No    Lack of Transportation (Non-Medical): No  Physical Activity: Not on file  Stress: Not on file  Social Connections: Not on file  Intimate Partner Violence: Not At Risk (09/23/2022)   Humiliation, Afraid, Rape, and Kick questionnaire    Fear of Current or Ex-Partner: No    Emotionally Abused: No    Physically Abused: No    Sexually Abused: No    ROS Negative unless indicated in HPI   Objective    There were no vitals taken for this visit.  Physical Exam  Last CBC Lab Results  Component Value Date   WBC 14.7 (H) 10/06/2022   HGB 14.6 10/06/2022   HCT 45.1 10/06/2022   MCV 95.3 10/06/2022   MCH 30.9 10/06/2022   RDW 12.6 10/06/2022   PLT 354 10/06/2022   Last metabolic panel Lab Results  Component Value Date   GLUCOSE 115 (H) 10/06/2022   NA 136 10/06/2022   K 3.8 10/06/2022   CL 101 10/06/2022   CO2 24 10/06/2022   BUN 21 10/06/2022   CREATININE 1.17 10/06/2022   GFRNONAA >60  10/06/2022   CALCIUM 8.9 10/06/2022   PROT 6.3 (L) 10/04/2022   ALBUMIN 2.8 (L) 10/04/2022   BILITOT 1.0 10/04/2022   ALKPHOS 60 10/04/2022   AST 36 10/04/2022   ALT 21 10/04/2022   ANIONGAP 11 10/06/2022   Last lipids Lab Results  Component Value Date   CHOL 179 09/25/2022   HDL 43 09/25/2022   LDLCALC 120 (H) 09/25/2022   TRIG 78 09/25/2022   CHOLHDL 4.2 09/25/2022   Last hemoglobin A1c No results found for: "HGBA1C" Last thyroid functions Lab Results  Component Value Date   TSH 2.565 09/24/2022        Assessment & Plan:  There are no diagnoses linked to this encounter.  Continue healthy lifestyle choices, including diet (rich in fruits, vegetables, and lean proteins, and low in salt and simple carbohydrates) and exercise (at least 30 minutes of moderate physical activity daily).     The above assessment and management plan was  discussed with the patient. The patient verbalized understanding of and has agreed to the management plan. Patient is aware to call the clinic if they develop any new symptoms or if symptoms persist or worsen. Patient is aware when to return to the clinic for a follow-up visit. Patient educated on when it is appropriate to go to the emergency department.  No follow-ups on file.   Frank Aran Santa Lighter, DNP [facility] [address] [place], [place] [address] [phone]

## 2023-01-05 ENCOUNTER — Encounter: Payer: Self-pay | Admitting: Nurse Practitioner

## 2023-01-05 ENCOUNTER — Ambulatory Visit (INDEPENDENT_AMBULATORY_CARE_PROVIDER_SITE_OTHER): Payer: Medicare Other | Admitting: Nurse Practitioner

## 2023-01-05 VITALS — BP 130/80 | HR 71 | Temp 98.0°F | Ht 71.5 in | Wt 170.2 lb

## 2023-01-05 DIAGNOSIS — Z0001 Encounter for general adult medical examination with abnormal findings: Secondary | ICD-10-CM

## 2023-01-05 DIAGNOSIS — N3942 Incontinence without sensory awareness: Secondary | ICD-10-CM | POA: Diagnosis not present

## 2023-01-05 DIAGNOSIS — I2510 Atherosclerotic heart disease of native coronary artery without angina pectoris: Secondary | ICD-10-CM

## 2023-01-05 DIAGNOSIS — N320 Bladder-neck obstruction: Secondary | ICD-10-CM

## 2023-01-05 DIAGNOSIS — Z125 Encounter for screening for malignant neoplasm of prostate: Secondary | ICD-10-CM | POA: Diagnosis not present

## 2023-01-05 DIAGNOSIS — Z0000 Encounter for general adult medical examination without abnormal findings: Secondary | ICD-10-CM | POA: Insufficient documentation

## 2023-01-05 DIAGNOSIS — I50811 Acute right heart failure: Secondary | ICD-10-CM

## 2023-01-05 DIAGNOSIS — F02C0 Dementia in other diseases classified elsewhere, severe, without behavioral disturbance, psychotic disturbance, mood disturbance, and anxiety: Secondary | ICD-10-CM

## 2023-01-05 DIAGNOSIS — G301 Alzheimer's disease with late onset: Secondary | ICD-10-CM

## 2023-01-05 NOTE — Progress Notes (Signed)
New Patient Office Visit  Subjective    Patient ID: Frank West, male    DOB: [birth year]  Age: 87 y.o. MRN: [id number]  CC:  Chief Complaint  Patient presents with   Establish Care   Dementia   Urinary Incontinence    HPI Frank West is an 87 yrs old male presents with his daughter on 01/05/2023 to establish care concerns for new onset of incontinence. PMH Hx CAD, dementia   Incontinence New Encounter: Reports new concerns regarding urinary incontinence, stating that he has had multiple accidents. He notes that, at times, he doesn't even feel the urge to urinate before an accident occurs "I sometimes don't even feel like I have to". He has started using Depends (adult diapers), which he reports have been somewhat helpful in managing the incontinence. There are no signs of pain, hematuria, or other urinary symptoms such as urgency or dysuria.  CAD History of coronary artery disease (CAD) who has not seen a cardiologist in several years. He was evaluated today in the office, and an EKG showed findings consistent with first-degree heart block, ectopic ventricular beats, and evidence of a previous anteroseptal infarct. Despite these findings, the patient is currently asymptomatic, with no reports of chest pain, palpitations, shortness of breath, or dizziness. The patient is taking aspirin daily as part of his management for CAD but has not been actively followed by cardiology in recent years. Given the EKG findings and his history, he will be referred to cardiology for further evaluation and management.  Dementia History of dementia, last evaluated by neurology 4 years ago. He presents today with concerns about worsening short-term memory. He reports difficulties with recalling recent events or conversations, which he finds troubling. His Mini-Mental Status Examination (MMSE) score today is 13, indicating moderate cognitive impairment. The patient has no other significant neurological  complaints, such as difficulty with language, motor skills, or vision. He is seeking further evaluation and management for his memory concerns and would like to discuss options for improving or stabilizing his cognitive function.No other significant symptoms or changes in health are noted at this time.   Past Medical History:  Diagnosis Date   Alzheimer disease (HCC)    CAD (coronary artery disease)    a. s/p prior stent in 2017 but details unavailable    Past Surgical History:  Procedure Laterality Date   CORONARY ANGIOPLASTY WITH STENT PLACEMENT     HIP ARTHROPLASTY Right 09/27/2022   Procedure: POSTERIOR HIP HEMIATHROPLASTY;  Surgeon: Joen Laura, MD;  Location: [facility] OR;  Service: Orthopedics;  Laterality: Right;   IR TRANSCATHETER BX  2007   done in [place]    uroscopy  2011    Family History  Problem Relation Age of Onset   Melanoma Father    Obesity Brother     Social History   Socioeconomic History   Marital status: Widowed    Spouse name: Not on file   Number of children: Not on file   Years of education: Not on file   Highest education level: Not on file  Occupational History   Not on file  Tobacco Use   Smoking status: Former   Smokeless tobacco: Never  Substance and Sexual Activity   Alcohol use: Never   Drug use: Never   Sexual activity: Not on file  Other Topics Concern   Not on file  Social History Narrative   Not on file   Social Determinants of Health   Financial Resource Strain:  Not on file  Food Insecurity: No Food Insecurity (09/23/2022)   Hunger Vital Sign    Worried About Running Out of Food in the Last Year: Never true    Ran Out of Food in the Last Year: Never true  Transportation Needs: No Transportation Needs (09/23/2022)   PRAPARE - Administrator, Civil Service (Medical): No    Lack of Transportation (Non-Medical): No  Physical Activity: Not on file  Stress: Not on file  Social Connections: Not on file  Intimate  Partner Violence: Not At Risk (09/23/2022)   Humiliation, Afraid, Rape, and Kick questionnaire    Fear of Current or Ex-Partner: No    Emotionally Abused: No    Physically Abused: No    Sexually Abused: No    Review of Systems  Constitutional:  Negative for fever.  HENT:  Positive for sore throat. Negative for ear pain.   Respiratory:  Negative for cough and shortness of breath.   Cardiovascular:  Negative for chest pain and leg swelling.  Gastrointestinal:  Negative for nausea and vomiting.  Genitourinary:        Incontinence  Musculoskeletal:  Positive for falls.  Skin:  Negative for itching and rash.  Neurological:  Negative for dizziness and headaches.  Endo/Heme/Allergies:  Negative for environmental allergies and polydipsia. Does not bruise/bleed easily.  Psychiatric/Behavioral:  Negative for depression. The patient does not have insomnia.    Negative unless indicated in HPI      Objective    BP 130/80   Pulse 71   Temp 98 F (36.7 C) (Temporal)   Ht 5' 11.5" (1.816 m)   Wt 170 lb 3.2 oz (77.2 kg)   SpO2 93%   BMI 23.41 kg/m   Physical Exam Vitals and nursing note reviewed.  Constitutional:      General: He is not in acute distress.    Appearance: Normal appearance.  HENT:     Head: Normocephalic and atraumatic.  Eyes:     General: No scleral icterus.    Extraocular Movements: Extraocular movements intact.     Conjunctiva/sclera: Conjunctivae normal.     Pupils: Pupils are equal, round, and reactive to light.  Cardiovascular:     Rate and Rhythm: Normal rate and regular rhythm.  Pulmonary:     Effort: Pulmonary effort is normal.     Breath sounds: Normal breath sounds. No wheezing.  Abdominal:     General: Bowel sounds are normal. There is no distension.     Palpations: Abdomen is soft. There is no mass.     Tenderness: There is no guarding or rebound.  Skin:    General: Skin is warm and dry.     Findings: No rash.  Neurological:     Mental Status:  He is alert and oriented to person, place, and time.     Motor: Weakness present.     Comments: MSE 13      Last CBC Lab Results  Component Value Date   WBC 14.7 (H) 10/06/2022   HGB 14.6 10/06/2022   HCT 45.1 10/06/2022   MCV 95.3 10/06/2022   MCH 30.9 10/06/2022   RDW 12.6 10/06/2022   PLT 354 10/06/2022   Last metabolic panel Lab Results  Component Value Date   GLUCOSE 115 (H) 10/06/2022   NA 136 10/06/2022   K 3.8 10/06/2022   CL 101 10/06/2022   CO2 24 10/06/2022   BUN 21 10/06/2022   CREATININE 1.17 10/06/2022   GFRNONAA >60  10/06/2022   CALCIUM 8.9 10/06/2022   PROT 6.3 (L) 10/04/2022   ALBUMIN 2.8 (L) 10/04/2022   BILITOT 1.0 10/04/2022   ALKPHOS 60 10/04/2022   AST 36 10/04/2022   ALT 21 10/04/2022   ANIONGAP 11 10/06/2022   Last lipids Lab Results  Component Value Date   CHOL 179 09/25/2022   HDL 43 09/25/2022   LDLCALC 120 (H) 09/25/2022   TRIG 78 09/25/2022   CHOLHDL 4.2 09/25/2022    Last thyroid functions Lab Results  Component Value Date   TSH 2.565 09/24/2022   Last vitamin D No results found for: "25OHVITD2", "25OHVITD3", "VD25OH" Last vitamin B12 and Folate No results found for: "VITAMINB12", "FOLATE"      Assessment & Plan:  Encounter for screening prostate specific antigen (PSA) measurement -     Thyroid Panel With TSH  Bladder-neck obstruction -     PSA, total and free  Urinary incontinence without sensory awareness -     For home use only DME Other see comment  Coronary artery disease involving native coronary artery of native heart without angina pectoris -     EKG 12-Lead -     Ambulatory referral to Cardiology  Encounter for general adult medical examination with abnormal findings -     CBC with Differential/Platelet -     CMP14+EGFR -     Lipid panel  Screening PSA (prostate specific antigen) -     PSA, total and free  Severe late onset Alzheimer's dementia, unspecified whether behavioral, psychotic, or mood  disturbance or anxiety (HCC) -     Ambulatory referral to Neurology  Acute right heart failure (HCC) -     CBC with Differential/Platelet  Frank West is an 87 yrs old Caucasian male Dementia: referred to neurology CAD: refer to cardiology, EKG EKG: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     The above assessment and management plan was discussed with the patient. The patient verbalized understanding of and has agreed to the management plan. Patient is aware to call the clinic if they develop any new symptoms or if symptoms persist or worsen. Patient is aware when to return to the clinic for a follow-up visit. Patient educated on when it is appropriate to go to the emergency department.  Return in about 5 months (around 06/05/2023).   Arrie Aran Santa Lighter, DNP [facility] [address] [place], [place] [address] [phone]

## 2023-01-06 LAB — CMP14+EGFR
ALT: 36 [IU]/L (ref 0–44)
AST: 53 [IU]/L — ABNORMAL HIGH (ref 0–40)
Albumin: 4.1 g/dL (ref 3.7–4.7)
Alkaline Phosphatase: 103 [IU]/L (ref 44–121)
BUN/Creatinine Ratio: 14 (ref 10–24)
BUN: 18 mg/dL (ref 8–27)
Bilirubin Total: 0.8 mg/dL (ref 0.0–1.2)
CO2: 23 mmol/L (ref 20–29)
Calcium: 9.7 mg/dL (ref 8.6–10.2)
Chloride: 102 mmol/L (ref 96–106)
Creatinine, Ser: 1.27 mg/dL (ref 0.76–1.27)
Globulin, Total: 3 g/dL (ref 1.5–4.5)
Glucose: 86 mg/dL (ref 70–99)
Potassium: 4.8 mmol/L (ref 3.5–5.2)
Sodium: 140 mmol/L (ref 134–144)
Total Protein: 7.1 g/dL (ref 6.0–8.5)
eGFR: 55 mL/min/{1.73_m2} — ABNORMAL LOW (ref >59)

## 2023-01-06 LAB — PSA, TOTAL AND FREE
PSA, Free Pct: 16.1 %
PSA, Free: 2.43 ng/mL
Prostate Specific Ag, Serum: 15.1 ng/mL — ABNORMAL HIGH (ref 0.0–4.0)

## 2023-01-06 LAB — CBC WITH DIFFERENTIAL/PLATELET
Basophils Absolute: 0.1 10*3/uL (ref 0.0–0.2)
Basos: 1 %
EOS (ABSOLUTE): 0.1 10*3/uL (ref 0.0–0.4)
Eos: 1 %
Hematocrit: 50.5 % (ref 37.5–51.0)
Hemoglobin: 16 g/dL (ref 13.0–17.7)
Immature Grans (Abs): 0.1 10*3/uL (ref 0.0–0.1)
Immature Granulocytes: 1 %
Lymphocytes Absolute: 2.7 10*3/uL (ref 0.7–3.1)
Lymphs: 29 %
MCH: 29.6 pg (ref 26.6–33.0)
MCHC: 31.7 g/dL (ref 31.5–35.7)
MCV: 94 fL (ref 79–97)
Monocytes Absolute: 0.7 10*3/uL (ref 0.1–0.9)
Monocytes: 8 %
Neutrophils Absolute: 5.6 10*3/uL (ref 1.4–7.0)
Neutrophils: 60 %
Platelets: 213 10*3/uL (ref 150–450)
RBC: 5.4 x10E6/uL (ref 4.14–5.80)
RDW: 12.2 % (ref 11.6–15.4)
WBC: 9.2 10*3/uL (ref 3.4–10.8)

## 2023-01-06 LAB — LIPID PANEL
Chol/HDL Ratio: 4.3 ratio (ref 0.0–5.0)
Cholesterol, Total: 175 mg/dL (ref 100–199)
HDL: 41 mg/dL (ref >39)
LDL Chol Calc (NIH): 117 mg/dL — ABNORMAL HIGH (ref 0–99)
Triglycerides: 93 mg/dL (ref 0–149)
VLDL Cholesterol Cal: 17 mg/dL (ref 5–40)

## 2023-01-06 LAB — THYROID PANEL WITH TSH
Free Thyroxine Index: 2.6 (ref 1.2–4.9)
T3 Uptake Ratio: 22 % — ABNORMAL LOW (ref 24–39)
T4, Total: 11.7 ug/dL (ref 4.5–12.0)
TSH: 1.69 u[IU]/mL (ref 0.450–4.500)

## 2023-01-07 ENCOUNTER — Encounter: Payer: Self-pay | Admitting: Physician Assistant

## 2023-01-11 NOTE — Progress Notes (Signed)
[facility] out of network, pt has to have both MCR/MCD for incontinence supplies. Sent order to [facility].

## 2023-02-01 ENCOUNTER — Encounter: Payer: Medicare Other | Admitting: Physician Assistant

## 2023-02-01 NOTE — Progress Notes (Incomplete)
Assessment/Plan:     Frank West is a very pleasant 87 y.o. year old RH male with a history of hypertension, hyperlipidemia, CAD, first-degree AV block, seen today for evaluation of memory loss.  Patient carries a diagnosis of dementia dating back to 4 years ago while living in [place].  Records are unavailable for review at this time.  Memory is reported to be worse especially short-term memory.  MoCA today is  /30***.  Patient needs assistance with some ADLs.  Patient no longer drives.  Dementia, late onset, concern for Alzheimer's disease***  MRI brain without contrast to assess for underlying structural abnormality and assess vascular load  Check B12  Recommend good control of cardiovascular risk factors.   Continue to control mood as per PCP Fol[place]ow up in    Subjective:    The patient is accompanied by ***  who supplements the history.    How long did patient have memory difficulties?  For the last 4 to 5 years***.  He had been diagnosed with late onset Alzheimer's dementia and had seen neurology, then losing to follow-up.  Patient has worsening difficulty remembering new information, recent conversations and names of people. repeats oneself?  Endorsed Disoriented when walking into a room?  Denies except occasionally not remembering what patient came to the room for ***  Leaving objects in unusual places?   Denies.  Wandering behavior? Denies.   Any personality changes, or depression, anxiety?  Has moments of irritability.*** Hallucinations or paranoia?  Denies.   Seizures? Denies.    Any sleep changes?  Sleeps well***does not sleep well***denies vivid dreams, REM behavior or sleepwalking.   Sleep apnea? Denies.   Any hygiene concerns?  Denies.   Independent of bathing and dressing?  Needs assistance getting dressed.*** Who is in charge of the medications? is in charge *** Who is in charge of the finances?   is in charge   *** Any changes in appetite?   Denies. ***    Patient have trouble swallowing?  Denies.   Does the patient cook?  No***  Any headaches?  Denies.   Chronic back pain?  Denies.   Ambulates with difficulty?  In the recent past, the patient sustained right femoral close fracture which limits his mobility.  Needs a walker to ambulate ***   Recent falls or head injuries?  In July 2024 he sustained severe head trauma without LOC.  CT at that time without evidence of intracranial bleed. Vision changes? Denies. Stroke like symptoms?  Denies.   Any tremors?  Denies.   Any anosmia?  Denies.   Any incontinence of urine?  Endorsed, uses depends. Any bowel dysfunction? Denies.      Patient lives with his family***  History of heavy alcohol intake? Denies.   History of heavy tobacco use? Denies.   Family history of dementia?   ***Denies. Does patient drive?***No longer drives  Pertinent labs July 2024 TSH 2.565, LDL 120  No Known Allergies  Current Outpatient Medications  Medication Instructions   ASPIRIN 81 PO 81 mg, Daily     VITALS:  There were no vitals filed for this visit.    PHYSICAL EXAM   HEENT:  Normocephalic, atraumatic. The mucous membranes are moist. The superficial temporal arteries are without ropiness or tenderness. Cardiovascular: Regular rate and rhythm. Lungs: Clear to auscultation bilaterally. Neck: There are no carotid bruits noted bilaterally.  NEUROLOGICAL:     No data to display  No data to display           Orientation:  Alert and oriented to person, not to place and time***. No aphasia or dysarthria. Fund of knowledge is reduced. Recent and remote memory impaired.  Attention and concentration are reduced.  Able to name objects and unable to repeat phrases. Delayed recall    Cranial nerves: There is good facial symmetry. Extraocular muscles are intact and visual fields are full to confrontational testing. Speech is fluent and clear, no tongue deviation. Hearing is intact to  conversational tone.*** Tone: Tone is good throughout. Sensation: Sensation is intact to light touch and pinprick throughout. Vibration is intact at the bilateral big toe. Coordination: The patient has no difficulty with RAM's or FNF bilaterally. Normal finger to nose  Motor: Strength is 5/5 in the bilateral upper and lower extremities. There is no pronator drift. There are no fasciculations noted. DTR's: Deep tendon reflexes are 2/4 .  Plantar responses are downgoing bilaterally. Gait and Station: The patient is able to ambulate with difficulty. Needs a walker to ambulate ***. Gait is cautious and narrow.      Thank you for allowing [place] the opportunity to participate in the care of this nice patient. Please do not hesitate to contact us for any questions or concerns.   Total time spent on today's visit was *** minutes dedicated to this patient today, preparing to see patient, examining the patient, ordering tests and/or medications and counseling the patient, documenting clinical information in the EHR or other health record, independently interpreting results and communicating results to the patient/family, discussing treatment and goals, answering patient's questions and coordinating care.  Cc:  Evern Bio, Dois Davenport, NP  [facility] 02/01/2023 7:12 AM

## 2023-02-24 ENCOUNTER — Encounter: Payer: Medicare Other | Admitting: Physician Assistant

## 2023-02-24 ENCOUNTER — Ambulatory Visit: Payer: Medicare Other

## 2023-02-24 ENCOUNTER — Encounter: Payer: Self-pay | Admitting: Physician Assistant

## 2023-02-24 DIAGNOSIS — Z029 Encounter for administrative examinations, unspecified: Secondary | ICD-10-CM

## 2023-04-07 ENCOUNTER — Ambulatory Visit: Payer: Medicare Other | Attending: Cardiology | Admitting: Cardiology

## 2023-04-07 ENCOUNTER — Encounter: Payer: Self-pay | Admitting: Cardiology

## 2023-04-07 ENCOUNTER — Ambulatory Visit: Payer: Medicare Other

## 2023-04-07 VITALS — BP 120/74 | HR 43 | Ht 72.0 in | Wt 172.4 lb

## 2023-04-07 DIAGNOSIS — R001 Bradycardia, unspecified: Secondary | ICD-10-CM | POA: Insufficient documentation

## 2023-04-07 DIAGNOSIS — R42 Dizziness and giddiness: Secondary | ICD-10-CM | POA: Diagnosis present

## 2023-04-07 DIAGNOSIS — I2510 Atherosclerotic heart disease of native coronary artery without angina pectoris: Secondary | ICD-10-CM | POA: Diagnosis present

## 2023-04-07 MED ORDER — ATORVASTATIN CALCIUM 40 MG PO TABS: 40.0000 mg | ORAL_TABLET | Freq: Every day | ORAL | 3 refills | Status: DC

## 2023-04-07 NOTE — Patient Instructions (Signed)
 Medication Instructions:  Your physician has recommended you make the following change in your medication:   -Start Atorvastatin  40 mg once daily   *If you need a refill on your cardiac medications before your next appointment, please call your pharmacy*   Lab Work: None If you have labs (blood work) drawn today and your tests are completely normal, you will receive your results only by: MyChart Message (if you have MyChart) OR A paper copy in the mail If you have any lab test that is abnormal or we need to change your treatment, we will call you to review the results.   Testing/Procedures: ZIO XT- Long Term Monitor Instructions   Your physician has requested you wear your ZIO patch monitor____7___days.   This is a single patch monitor.  [employer] supplies one patch monitor per enrollment.  Additional stickers are not available.   Please do not apply patch if you will be having a Nuclear Stress Test, Echocardiogram, Cardiac CT, MRI, or Chest Xray during the time frame you would be wearing the monitor. The patch cannot be worn during these tests.  You cannot remove and re-apply the ZIO XT patch monitor.   Your ZIO patch monitor will be sent USPS Priority mail from [facility] directly to your home address. The monitor may also be mailed to a PO BOX if home delivery is not available.   It may take 3-5 days to receive your monitor after you have been enrolled.   Once you have received you monitor, please review enclosed instructions.  Your monitor has already been registered assigning a specific monitor serial # to you.   Applying the monitor   Shave hair from upper left chest.   Hold abrader disc by orange tab.  Rub abrader in 40 strokes over left upper chest as indicated in your monitor instructions.   Clean area with 4 enclosed alcohol  pads .  Use all pads to assure are is cleaned thoroughly.  Let dry.   Apply patch as indicated in monitor instructions.  Patch will be  place under collarbone on left side of chest with arrow pointing upward.   Rub patch adhesive wings for 2 minutes.Remove white label marked 1.  Remove white label marked 2.  Rub patch adhesive wings for 2 additional minutes.   While looking in a mirror, press and release button in center of patch.  A small green light will flash 3-4 times .  This will be your only indicator the monitor has been turned on.     Do not shower for the first 24 hours.  You may shower after the first 24 hours.   Press button if you feel a symptom. You will hear a small click.  Record Date, Time and Symptom in the Patient Log Book.   When you are ready to remove patch, follow instructions on last 2 pages of Patient Log Book.  Stick patch monitor onto last page of Patient Log Book.   Place Patient Log Book in [place] box.  Use locking tab on box and tape box closed securely.  The [employer] has [employer] on it.  Please place in mailbox as soon as possible.  Your physician should have your test results approximately 7 days after the monitor has been mailed back to [facility].   Call [facility] Customer Care at [phone] if you have questions regarding your ZIO XT patch monitor.  Call them immediately if you see an orange light blinking on your  monitor.   If your monitor falls off in less than 4 days contact our Monitor department at [phone].  If your monitor becomes loose or falls off after 4 days call [employer] at [phone] for suggestions on securing your monitor.     Follow-Up: At [facility], you and your health needs are our priority.  As part of our continuing mission to provide you with exceptional heart care, we have created designated Provider Care Teams.  These Care Teams include your primary Cardiologist (physician) and Advanced Practice Providers (APPs -  Physician Assistants and Nurse Practitioners) who all work together to provide you with the care you need,  when you need it.  We recommend signing up for the patient portal called MyChart.  Sign up information is provided on this After Visit Summary.  MyChart is used to connect with patients for Virtual Visits (Telemedicine).  Patients are able to view lab/test results, encounter notes, upcoming appointments, etc.  Non-urgent messages can be sent to your provider as well.   To learn more about what you can do with MyChart, go to [email or web address].    Your next appointment:   3 month(s)  Provider:   You may see Alvan Carrier, MD or one of the following Advanced Practice Providers on your designated Care Team:   Laymon Qua, PA-C  Olivia Pavy, [place]     Other Instructions

## 2023-04-07 NOTE — Progress Notes (Signed)
 Clinical Summary Frank West is a 88 y.o.male seen today for follow up of the following medical problems.   1. CAD - he reports stent placed in [place] , he believes [place]. He reports received a stent. Believes was placed in 2017.    08/2022 echo LVEF 55-60%,  no WMAs, grade I dd  - no recent chest pains. No SOB/DOE  - compliant with meds. Has not been on statin it appears    2.Dementia  - family reports some progression. He still remains active, independent in ADLs  3. PVCs - no recent symptoms  4. Hip fracture - s/p surgery 08/2022   5. Dizziness - intermittent dizziness at times. At times can occur with standing, other times not associated with position.  - EKG with chronic trifascicular block   Past Medical History:  Diagnosis Date   Alzheimer disease (HCC)    CAD (coronary artery disease)    a. s/p prior stent in 2017 but details unavailable     No Known Allergies   Current Outpatient Medications  Medication Sig Dispense Refill   ASPIRIN  81 PO 81 mg daily.     No current facility-administered medications for this visit.     Past Surgical History:  Procedure Laterality Date   CORONARY ANGIOPLASTY WITH STENT PLACEMENT     HIP ARTHROPLASTY Right 09/27/2022   Procedure: POSTERIOR HIP HEMIATHROPLASTY;  Surgeon: Edna Toribio LABOR, MD;  Location: [facility] OR;  Service: Orthopedics;  Laterality: Right;   IR TRANSCATHETER BX  2007   done in [place]     uroscopy  2011     No Known Allergies    Family History  Problem Relation Age of Onset   Melanoma Father    Obesity Brother      Social History Mr. Figg reports that he has quit smoking. He has never used smokeless tobacco. Mr. Ponzo reports no history of alcohol  use.     Physical Examination Today's Vitals   04/07/23 1259  BP: 120/74  Pulse: (!) 43  SpO2: 99%  Weight: 172 lb 6.4 oz (78.2 kg)  Height: 6' (1.829 m)   Body mass index is 23.38 kg/m.  Gen: resting comfortably, no acute  distress HEENT: no scleral icterus, pupils equal round and reactive, no palptable cervical adenopathy,  CV: RRR, no m/rg, no jvd Resp: Clear to auscultation bilaterally GI: abdomen is soft, non-tender, non-distended, normal bowel sounds, no hepatosplenomegaly MSK: extremities are warm, no edema.  Skin: warm, no rash Neuro:  no focal deficits Psych: appropriate affect   Diagnostic Studies  08/2022 echo 1. Left ventricular ejection fraction, by estimation, is 55 to 60%. The  left ventricle has normal function. The left ventricle has no regional  wall motion abnormalities. There is moderate left ventricular hypertrophy.  Left ventricular diastolic  parameters are consistent with Grade I diastolic dysfunction (impaired  relaxation).   2. Right ventricular systolic function is normal. The right ventricular  size is normal.   3. The mitral valve is normal in structure. No evidence of mitral valve  regurgitation. No evidence of mitral stenosis.   4. The aortic valve has an indeterminant number of cusps. There is mild  calcification of the aortic valve. There is mild thickening of the aortic  valve. Aortic valve regurgitation is not visualized. No aortic stenosis is  present.   5. The inferior vena cava is normal in size with greater than 50%  respiratory variability, suggesting right atrial pressure of 3 mmHg.  Assessment and Plan   1. Chest pain/CAD - no recent symptoms - continue ASA, should be on statin. Start 40mg  daily of atorvastatin   2. Dizziness - some component of orthostatic symptoms, he does endorse poor  oral hydration. Discussed increasing daily hydration - other episodes of dizziness and altered mentation can come on not brought on by position. He has conduction disease on EKG with first degree AV block and LAFB/RBBB. Plan for 7 day zio patch to further evaluate - EKG today shows SR, PVCs, HR 60s. Dynamap HR in the 40s likely due to undetected PVCs     Dorn PHEBE Ross, M.D.

## 2023-04-17 ENCOUNTER — Other Ambulatory Visit: Payer: Self-pay

## 2023-04-17 ENCOUNTER — Encounter (HOSPITAL_COMMUNITY): Payer: Self-pay

## 2023-04-17 ENCOUNTER — Inpatient Hospital Stay (HOSPITAL_COMMUNITY)
Admission: EM | Admit: 2023-04-17 | Discharge: 2023-04-21 | DRG: 689 | Disposition: A | Discharge status: Skilled Nursing Facility | Payer: Medicare Other | Attending: Internal Medicine | Admitting: Internal Medicine

## 2023-04-17 ENCOUNTER — Emergency Department (HOSPITAL_COMMUNITY): Payer: Medicare Other

## 2023-04-17 DIAGNOSIS — Z79899 Other long term (current) drug therapy: Secondary | ICD-10-CM

## 2023-04-17 DIAGNOSIS — Z955 Presence of coronary angioplasty implant and graft: Secondary | ICD-10-CM

## 2023-04-17 DIAGNOSIS — E785 Hyperlipidemia, unspecified: Secondary | ICD-10-CM | POA: Diagnosis not present

## 2023-04-17 DIAGNOSIS — R531 Weakness: Principal | ICD-10-CM

## 2023-04-17 DIAGNOSIS — N3000 Acute cystitis without hematuria: Principal | ICD-10-CM | POA: Diagnosis present

## 2023-04-17 DIAGNOSIS — Z87891 Personal history of nicotine dependence: Secondary | ICD-10-CM

## 2023-04-17 DIAGNOSIS — Z96641 Presence of right artificial hip joint: Secondary | ICD-10-CM | POA: Diagnosis present

## 2023-04-17 DIAGNOSIS — R6520 Severe sepsis without septic shock: Secondary | ICD-10-CM | POA: Diagnosis present

## 2023-04-17 DIAGNOSIS — A4150 Gram-negative sepsis, unspecified: Principal | ICD-10-CM

## 2023-04-17 DIAGNOSIS — N390 Urinary tract infection, site not specified: Secondary | ICD-10-CM

## 2023-04-17 DIAGNOSIS — R410 Disorientation, unspecified: Secondary | ICD-10-CM | POA: Diagnosis not present

## 2023-04-17 DIAGNOSIS — F0280 Dementia in other diseases classified elsewhere without behavioral disturbance: Secondary | ICD-10-CM | POA: Diagnosis present

## 2023-04-17 DIAGNOSIS — N400 Enlarged prostate without lower urinary tract symptoms: Secondary | ICD-10-CM | POA: Diagnosis present

## 2023-04-17 DIAGNOSIS — Z1152 Encounter for screening for COVID-19: Secondary | ICD-10-CM

## 2023-04-17 DIAGNOSIS — I444 Left anterior fascicular block: Secondary | ICD-10-CM | POA: Diagnosis present

## 2023-04-17 DIAGNOSIS — I2510 Atherosclerotic heart disease of native coronary artery without angina pectoris: Secondary | ICD-10-CM | POA: Diagnosis present

## 2023-04-17 DIAGNOSIS — Z808 Family history of malignant neoplasm of other organs or systems: Secondary | ICD-10-CM

## 2023-04-17 DIAGNOSIS — G9341 Metabolic encephalopathy: Secondary | ICD-10-CM | POA: Diagnosis present

## 2023-04-17 DIAGNOSIS — G309 Alzheimer's disease, unspecified: Secondary | ICD-10-CM | POA: Diagnosis present

## 2023-04-17 DIAGNOSIS — N179 Acute kidney failure, unspecified: Secondary | ICD-10-CM | POA: Diagnosis present

## 2023-04-17 DIAGNOSIS — R7989 Other specified abnormal findings of blood chemistry: Secondary | ICD-10-CM | POA: Diagnosis present

## 2023-04-17 DIAGNOSIS — F919 Conduct disorder, unspecified: Secondary | ICD-10-CM | POA: Diagnosis present

## 2023-04-17 DIAGNOSIS — I440 Atrioventricular block, first degree: Secondary | ICD-10-CM | POA: Diagnosis present

## 2023-04-17 LAB — CBC WITH DIFFERENTIAL/PLATELET
Abs Immature Granulocytes: 0.05 10*3/uL (ref 0.00–0.07)
Basophils Absolute: 0.1 10*3/uL (ref 0.0–0.1)
Basophils Relative: 0 %
Eosinophils Absolute: 0.1 10*3/uL (ref 0.0–0.5)
Eosinophils Relative: 1 %
HCT: 50.8 % (ref 39.0–52.0)
Hemoglobin: 16.3 g/dL (ref 13.0–17.0)
Immature Granulocytes: 0 %
Lymphocytes Relative: 22 %
Lymphs Abs: 2.6 10*3/uL (ref 0.7–4.0)
MCH: 30.2 pg (ref 26.0–34.0)
MCHC: 32.1 g/dL (ref 30.0–36.0)
MCV: 94.1 fL (ref 80.0–100.0)
Monocytes Absolute: 1.4 10*3/uL — ABNORMAL HIGH (ref 0.1–1.0)
Monocytes Relative: 12 %
Neutro Abs: 7.5 10*3/uL (ref 1.7–7.7)
Neutrophils Relative %: 65 %
Platelets: 178 10*3/uL (ref 150–400)
RBC: 5.4 MIL/uL (ref 4.22–5.81)
RDW: 12.4 % (ref 11.5–15.5)
WBC: 11.6 10*3/uL — ABNORMAL HIGH (ref 4.0–10.5)
nRBC: 0 % (ref 0.0–0.2)

## 2023-04-17 LAB — URINALYSIS, ROUTINE W REFLEX MICROSCOPIC
Bacteria, UA: NONE SEEN
Bilirubin Urine: NEGATIVE
Glucose, UA: NEGATIVE mg/dL
Ketones, ur: NEGATIVE mg/dL
Nitrite: NEGATIVE
Protein, ur: 100 mg/dL — AB
Specific Gravity, Urine: 1.014 (ref 1.005–1.030)
WBC, UA: >50 WBC/hpf (ref 0–5)
pH: 5 (ref 5.0–8.0)

## 2023-04-17 LAB — COMPREHENSIVE METABOLIC PANEL
ALT: 40 U/L (ref 0–44)
AST: 37 U/L (ref 15–41)
Albumin: 3.7 g/dL (ref 3.5–5.0)
Alkaline Phosphatase: 73 U/L (ref 38–126)
Anion gap: 12 (ref 5–15)
BUN: 36 mg/dL — ABNORMAL HIGH (ref 8–23)
CO2: 22 mmol/L (ref 22–32)
Calcium: 9.3 mg/dL (ref 8.9–10.3)
Chloride: 101 mmol/L (ref 98–111)
Creatinine, Ser: 1.59 mg/dL — ABNORMAL HIGH (ref 0.61–1.24)
GFR, Estimated: 42 mL/min — ABNORMAL LOW (ref >60)
Glucose, Bld: 128 mg/dL — ABNORMAL HIGH (ref 70–99)
Potassium: 3.8 mmol/L (ref 3.5–5.1)
Sodium: 135 mmol/L (ref 135–145)
Total Bilirubin: 1.7 mg/dL — ABNORMAL HIGH (ref 0.0–1.2)
Total Protein: 7.7 g/dL (ref 6.5–8.1)

## 2023-04-17 LAB — RESP PANEL BY RT-PCR (RSV, FLU A&B, COVID)  RVPGX2
Influenza A by PCR: NEGATIVE
Influenza B by PCR: NEGATIVE
Resp Syncytial Virus by PCR: NEGATIVE
SARS Coronavirus 2 by RT PCR: NEGATIVE

## 2023-04-17 LAB — CBG MONITORING, ED: Glucose-Capillary: 140 mg/dL — ABNORMAL HIGH (ref 70–99)

## 2023-04-17 LAB — MAGNESIUM: Magnesium: 2.1 mg/dL (ref 1.7–2.4)

## 2023-04-17 LAB — TROPONIN I (HIGH SENSITIVITY): Troponin I (High Sensitivity): 7 ng/L (ref <18)

## 2023-04-17 MED ORDER — ENOXAPARIN SODIUM 40 MG/0.4ML IJ SOSY
40.0000 mg | PREFILLED_SYRINGE | INTRAMUSCULAR | Status: DC
  Administered 2023-04-18 – 2023-04-21 (×4): 40 mg via SUBCUTANEOUS
  Filled 2023-04-17 (×4): qty 0.4

## 2023-04-17 MED ORDER — ATORVASTATIN CALCIUM 40 MG PO TABS
40.0000 mg | ORAL_TABLET | Freq: Every day | ORAL | Status: DC
  Administered 2023-04-18 – 2023-04-21 (×4): 40 mg via ORAL
  Filled 2023-04-17 (×4): qty 1

## 2023-04-17 MED ORDER — ONDANSETRON HCL 4 MG/2ML IJ SOLN: 4.0000 mg | Freq: Four times a day (QID) | INTRAMUSCULAR | Status: DC | PRN

## 2023-04-17 MED ORDER — ACETAMINOPHEN 650 MG RE SUPP: 650.0000 mg | Freq: Four times a day (QID) | RECTAL | Status: DC | PRN

## 2023-04-17 MED ORDER — MAGNESIUM HYDROXIDE 400 MG/5ML PO SUSP: 30.0000 mL | Freq: Every day | ORAL | Status: DC | PRN

## 2023-04-17 MED ORDER — LACTATED RINGERS IV SOLN
150.0000 mL/h | INTRAVENOUS | Status: AC
Start: 2023-04-17 — End: 2023-04-18
  Administered 2023-04-18: 150 mL/h via INTRAVENOUS

## 2023-04-17 MED ORDER — ONDANSETRON HCL 4 MG PO TABS
4.0000 mg | ORAL_TABLET | Freq: Four times a day (QID) | ORAL | Status: DC | PRN
Start: 2023-04-17 — End: 2023-04-21

## 2023-04-17 MED ORDER — HALOPERIDOL LACTATE 5 MG/ML IJ SOLN
1.0000 mg | Freq: Four times a day (QID) | INTRAMUSCULAR | Status: DC | PRN
  Administered 2023-04-18: 1 mg via INTRAMUSCULAR
  Filled 2023-04-17 (×2): qty 1

## 2023-04-17 MED ORDER — SODIUM CHLORIDE 0.9 % IV SOLN
2.0000 g | INTRAVENOUS | Status: DC
  Administered 2023-04-17 – 2023-04-19 (×3): 2 g via INTRAVENOUS
  Filled 2023-04-17 (×3): qty 20

## 2023-04-17 MED ORDER — ACETAMINOPHEN 325 MG PO TABS: 650.0000 mg | ORAL_TABLET | Freq: Four times a day (QID) | ORAL | Status: DC | PRN

## 2023-04-17 MED ORDER — TRAZODONE HCL 50 MG PO TABS
25.0000 mg | ORAL_TABLET | Freq: Every evening | ORAL | Status: DC | PRN
  Administered 2023-04-17: 25 mg via ORAL
  Filled 2023-04-17: qty 1

## 2023-04-17 NOTE — ED Provider Notes (Signed)
 [place] EMERGENCY DEPARTMENT AT [facility] Provider Note   CSN: [id number] Arrival date & time: 04/17/23  1757     History {Add pertinent medical, surgical, social history, OB history to HPI:1} No chief complaint on file.   Frank West is a 88 y.o. male with PMH as listed below who presents with ***.    Past Medical History:  Diagnosis Date   Alzheimer disease (HCC)    CAD (coronary artery disease)    a. s/p prior stent in 2017 but details unavailable       Home Medications Prior to Admission medications   Medication Sig Start Date End Date Taking? Authorizing Provider  ASPIRIN 81 PO 81 mg daily. 10/25/22   [provider]  atorvastatin (LIPITOR) 40 MG tablet Take 1 tablet (40 mg total) by mouth daily. 04/07/23 07/06/23  Antoine Poche, MD      Allergies    Patient has no known allergies.    Review of Systems   Review of Systems A 10 point review of systems was performed and is negative unless otherwise reported in HPI.  Physical Exam Updated Vital Signs BP 123/82 (BP Location: Left Arm)   Pulse 86   Temp 98.5 F (36.9 C) (Oral)   Resp 15   Ht 6' (1.829 m)   Wt 81.6 kg   SpO2 98%   BMI 24.41 kg/m  Physical Exam General: Normal appearing {Desc; male/male:11659}, lying in bed.  HEENT: PERRLA, Sclera anicteric, MMM, trachea midline.  Cardiology: RRR, no murmurs/rubs/gallops. BL radial and DP pulses equal bilaterally.  Resp: Normal respiratory rate and effort. CTAB, no wheezes, rhonchi, crackles.  Abd: Soft, non-tender, non-distended. No rebound tenderness or guarding.  GU: Deferred. MSK: No peripheral edema or signs of trauma. Extremities without deformity or TTP. No cyanosis or clubbing. Skin: warm, dry. No rashes or lesions. Back: No CVA tenderness Neuro: A&Ox4, CNs II-XII grossly intact. MAEs. Sensation grossly intact.  Psych: Normal mood and affect.   ED Results / Procedures / Treatments   Labs (all labs ordered are  listed, but only abnormal results are displayed) Labs Reviewed - No data to display  EKG None  Radiology No results found.  Procedures Procedures  {Document cardiac monitor, telemetry assessment procedure when appropriate:1}  Medications Ordered in ED Medications - No data to display  ED Course/ Medical Decision Making/ A&P                          Medical Decision Making   This patient presents to the ED for concern of ***, this involves an extensive number of treatment options, and is a complaint that carries with it a high risk of complications and morbidity.  I considered the following differential and admission for this acute, potentially life threatening condition.   MDM:    ***     Labs: I Ordered, and personally interpreted labs.  The pertinent results include:  ***  Imaging Studies ordered: I ordered imaging studies including *** I independently visualized and interpreted imaging. I agree with the radiologist interpretation  Additional history obtained from ***.  External records from outside source obtained and reviewed including ***  Cardiac Monitoring: The patient was maintained on a cardiac monitor.  I personally viewed and interpreted the cardiac monitored which showed an underlying rhythm of: ***  Reevaluation: After the interventions noted above, I reevaluated the patient and found that they have :{resolved/improved/worsened:23923::"improved"}  Social Determinants of Health: ***  Disposition:  ***  Co morbidities that complicate the patient evaluation  Past Medical History:  Diagnosis Date   Alzheimer disease (HCC)    CAD (coronary artery disease)    a. s/p prior stent in 2017 but details unavailable     Medicines No orders of the defined types were placed in this encounter.   I have reviewed the patients home medicines and have made adjustments as needed  Problem List / ED Course: Problem List Items Addressed This Visit   None         {Document critical care time when appropriate:1} {Document review of labs and clinical decision tools ie heart score, Chads2Vasc2 etc:1}  {Document your independent review of radiology images, and any outside records:1} {Document your discussion with family members, caretakers, and with consultants:1} {Document social determinants of health affecting pt's care:1} {Document your decision making why or why not admission, treatments were needed:1}  This note was created using dictation software, which may contain spelling or grammatical errors.

## 2023-04-17 NOTE — ED Notes (Signed)
 ED Provider at bedside.

## 2023-04-17 NOTE — H&P (Signed)
 [place]   PATIENT NAME: Frank West    MR#:  [id number]  DATE OF BIRTH:  Jul 14, 1935  DATE OF ADMISSION:  04/17/2023  PRIMARY CARE PHYSICIAN: Evern Bio, Dois Davenport, NP   Patient is coming from: Home  REQUESTING/REFERRING PHYSICIAN: Loetta Rough, MD   CHIEF COMPLAINT:  No chief complaint on file.   HISTORY OF PRESENT ILLNESS:  Frank West is a 88 y.o. male with medical history significant for coronary artery disease status post PCI and stent and Alzheimer's dementia, who presented to the emergency room with acute onset of altered mental status with confusion, garbled none sensible speech and generalized weakness with 2 potential falls.  The patient was caught up during both falls and therefore had no injuries.  He has been having urinary frequency and urgency without dysuria or hematuria or flank pain.  No reported fever however has been having chills.  No nausea or vomiting or diarrhea or abdominal pain.  No chest pain or palpitations.  No cough or wheezing or hemoptysis.  ED Course: When he came to the ER within normal.  Labs revealed a BUN of 6 with a creatinine of 1.59, above previous levels.  CBC showed leukocytosis of 11.6.  Respiratory panel came back negative.  UA was positive for UTI. EKG as reviewed by me : EKG showed normal sinus rhythm with rate of 89 with multiple ventricular premature complexes and prolonged PR interval with left anterior fascicular block. Imaging: Portable chest x-ray showed hypoventilation with no acute cardiopulmonary disease.  Noncontrast head CT scan showed no acute intracranial normalities.  The patient was ordered IV Rocephin.  He will be admitted to a medical observation bed for further evaluation and management. PAST MEDICAL HISTORY:   Past Medical History:  Diagnosis Date   Alzheimer disease (HCC)    CAD (coronary artery disease)    a. s/p prior stent in 2017 but details unavailable    PAST SURGICAL HISTORY:   Past  Surgical History:  Procedure Laterality Date   CORONARY ANGIOPLASTY WITH STENT PLACEMENT     HIP ARTHROPLASTY Right 09/27/2022   Procedure: POSTERIOR HIP HEMIATHROPLASTY;  Surgeon: Joen Laura, MD;  Location: [facility] OR;  Service: Orthopedics;  Laterality: Right;   IR TRANSCATHETER BX  2007   done in [place]    uroscopy  2011    SOCIAL HISTORY:   Social History   Tobacco Use   Smoking status: Former   Smokeless tobacco: Never  Substance Use Topics   Alcohol use: Never    FAMILY HISTORY:   Family History  Problem Relation Age of Onset   Melanoma Father    Obesity Brother     DRUG ALLERGIES:  No Known Allergies  REVIEW OF SYSTEMS:   ROS As per history of present illness. All pertinent systems were reviewed above. Constitutional, HEENT, cardiovascular, respiratory, GI, GU, musculoskeletal, neuro, psychiatric, endocrine, integumentary and hematologic systems were reviewed and are otherwise negative/unremarkable except for positive findings mentioned above in the HPI.   MEDICATIONS AT HOME:   Prior to Admission medications   Medication Sig Start Date End Date Taking? Authorizing Provider  ASPIRIN 81 PO 81 mg daily. 10/25/22   [provider]  atorvastatin (LIPITOR) 40 MG tablet Take 1 tablet (40 mg total) by mouth daily. 04/07/23 07/06/23  Antoine Poche, MD      VITAL SIGNS:  Blood pressure (!) 146/85, pulse 88, temperature 98.5 F (36.9 C), temperature source Oral, resp. rate 15,  height 6' (1.829 m), weight 81.6 kg, SpO2 98%.  PHYSICAL EXAMINATION:  Physical Exam  GENERAL:  89 y.o.-year-old Caucasian male patient lying in the bed with no acute distress.  EYES: Pupils equal, round, reactive to light and accommodation. No scleral icterus. Extraocular muscles intact.  HEENT: Head atraumatic, normocephalic. Oropharynx and nasopharynx clear.  NECK:  Supple, no jugular venous distention. No thyroid enlargement, no tenderness.  LUNGS: Normal breath sounds  bilaterally, no wheezing, rales,rhonchi or crepitation. No use of accessory muscles of respiration.  CARDIOVASCULAR: Regular rate and rhythm, S1, S2 normal. No murmurs, rubs, or gallops.  ABDOMEN: Soft, nondistended, nontender. Bowel sounds present. No organomegaly or mass.  EXTREMITIES: No pedal edema, cyanosis, or clubbing.  NEUROLOGIC: Cranial nerves II through XII are intact. Muscle strength 5/5 in all extremities. Sensation intact. Gait not checked.  PSYCHIATRIC: The patient is alert and oriented x 3.  Normal affect and good eye contact. SKIN: No obvious rash, lesion, or ulcer.   LABORATORY PANEL:   CBC Recent Labs  Lab 04/17/23 2045  WBC 11.6*  HGB 16.3  HCT 50.8  PLT 178   ------------------------------------------------------------------------------------------------------------------  Chemistries  Recent Labs  Lab 04/17/23 2045  NA 135  K 3.8  CL 101  CO2 22  GLUCOSE 128*  BUN 36*  CREATININE 1.59*  CALCIUM 9.3  MG 2.1  AST 37  ALT 40  ALKPHOS 73  BILITOT 1.7*   ------------------------------------------------------------------------------------------------------------------  Cardiac Enzymes No results for input(s): "TROPONINI" in the last 168 hours. ------------------------------------------------------------------------------------------------------------------  RADIOLOGY:  CT Head Wo Contrast Result Date: 04/17/2023 CLINICAL DATA:  Mental status change, unknown cause EXAM: CT HEAD WITHOUT CONTRAST TECHNIQUE: Contiguous axial images were obtained from the base of the skull through the vertex without intravenous contrast. RADIATION DOSE REDUCTION: This exam was performed according to the departmental dose-optimization program which includes automated exposure control, adjustment of the mA and/or kV according to patient size and/or use of iterative reconstruction technique. COMPARISON:  09/23/2022 FINDINGS: Brain: No intracranial hemorrhage, mass effect, or  midline shift. Stable degree of atrophy and chronic small vessel ischemia. No hydrocephalus. The basilar cisterns are patent. No evidence of territorial infarct or acute ischemia. No extra-axial or intracranial fluid collection. Vascular: Atherosclerosis of skullbase vasculature without hyperdense vessel or abnormal calcification. Skull: No fracture or focal lesion. Sinuses/Orbits: Paranasal sinuses and mastoid air cells are clear. The visualized orbits are unremarkable. Other: None. IMPRESSION: No acute intracranial abnormality. Electronically Signed   By: Narda Rutherford M.D.   On: 04/17/2023 21:42   DG Chest Portable 1 View Result Date: 04/17/2023 CLINICAL DATA:  Altered mental status.  Tachypnea. EXAM: PORTABLE CHEST 1 VIEW COMPARISON:  07/17/2019 FINDINGS: Low lung volumes.The cardiomediastinal contours are normal. Pulmonary vasculature is normal. No consolidation, pleural effusion, or pneumothorax. No acute osseous abnormalities are seen. IMPRESSION: Hypoventilatory chest without acute findings. Electronically Signed   By: Narda Rutherford M.D.   On: 04/17/2023 21:33      IMPRESSION AND PLAN:  Assessment and Plan: * Acute metabolic encephalopathy - This is like secondary to UTI. - The patient will be admitted to a medical observation bed. - Management otherwise as below.  Sepsis due to gram-negative UTI (HCC) - We will continue to pack therapy with IV Rocephin. - We will follow urine and blood cultures. - We will continue hydration with IV lactated ringer.  Dyslipidemia - We will continue statin therapy.   DVT prophylaxis: Lovenox.  Advanced Care Planning:  Code Status: full code.  Family Communication:  The  plan of care was discussed in details with the patient (and family). I answered all questions. The patient agreed to proceed with the above mentioned plan. Further management will depend upon hospital course. Disposition Plan: Back to previous home environment Consults called:  none.  All the records are reviewed and case discussed with ED provider.  Status is: Observation  I certify that at the time of admission, it is my clinical judgment that the patient will require hospital care extending less than 2 midnights.                            Dispo: The patient is from: Home              Anticipated d/c is to: Home              Patient currently is not medically stable to d/c.              Difficult to place patient: No  Hannah Beat M.D on 04/18/2023 at 2:56 AM  [facility]   From 7 PM-7 AM, contact night-coverage [email or web address]  CC: Primary care physician; Martina Sinner, NP

## 2023-04-17 NOTE — ED Triage Notes (Addendum)
 Patient was brought by [facility] from home, called by family for having an onset of generalized weakness beginning today and observed orthostatic hypotension per EMS. Patient has been under recent cardiac monitoring and awaiting results.

## 2023-04-18 DIAGNOSIS — A419 Sepsis, unspecified organism: Secondary | ICD-10-CM

## 2023-04-18 DIAGNOSIS — Z808 Family history of malignant neoplasm of other organs or systems: Secondary | ICD-10-CM | POA: Diagnosis not present

## 2023-04-18 DIAGNOSIS — R410 Disorientation, unspecified: Secondary | ICD-10-CM | POA: Diagnosis present

## 2023-04-18 DIAGNOSIS — Z79899 Other long term (current) drug therapy: Secondary | ICD-10-CM | POA: Diagnosis not present

## 2023-04-18 DIAGNOSIS — E785 Hyperlipidemia, unspecified: Secondary | ICD-10-CM | POA: Diagnosis present

## 2023-04-18 DIAGNOSIS — N390 Urinary tract infection, site not specified: Secondary | ICD-10-CM | POA: Diagnosis not present

## 2023-04-18 DIAGNOSIS — Z96641 Presence of right artificial hip joint: Secondary | ICD-10-CM | POA: Diagnosis present

## 2023-04-18 DIAGNOSIS — I444 Left anterior fascicular block: Secondary | ICD-10-CM | POA: Diagnosis present

## 2023-04-18 DIAGNOSIS — F0280 Dementia in other diseases classified elsewhere without behavioral disturbance: Secondary | ICD-10-CM | POA: Diagnosis present

## 2023-04-18 DIAGNOSIS — F919 Conduct disorder, unspecified: Secondary | ICD-10-CM | POA: Diagnosis present

## 2023-04-18 DIAGNOSIS — I2510 Atherosclerotic heart disease of native coronary artery without angina pectoris: Secondary | ICD-10-CM | POA: Diagnosis present

## 2023-04-18 DIAGNOSIS — G309 Alzheimer's disease, unspecified: Secondary | ICD-10-CM | POA: Diagnosis present

## 2023-04-18 DIAGNOSIS — Z955 Presence of coronary angioplasty implant and graft: Secondary | ICD-10-CM | POA: Diagnosis not present

## 2023-04-18 DIAGNOSIS — I440 Atrioventricular block, first degree: Secondary | ICD-10-CM | POA: Diagnosis present

## 2023-04-18 DIAGNOSIS — Z87891 Personal history of nicotine dependence: Secondary | ICD-10-CM | POA: Diagnosis not present

## 2023-04-18 DIAGNOSIS — G9341 Metabolic encephalopathy: Secondary | ICD-10-CM | POA: Diagnosis present

## 2023-04-18 DIAGNOSIS — N400 Enlarged prostate without lower urinary tract symptoms: Secondary | ICD-10-CM | POA: Diagnosis present

## 2023-04-18 DIAGNOSIS — A4150 Gram-negative sepsis, unspecified: Secondary | ICD-10-CM

## 2023-04-18 DIAGNOSIS — N179 Acute kidney failure, unspecified: Secondary | ICD-10-CM | POA: Diagnosis present

## 2023-04-18 DIAGNOSIS — N3000 Acute cystitis without hematuria: Secondary | ICD-10-CM | POA: Diagnosis present

## 2023-04-18 DIAGNOSIS — Z1152 Encounter for screening for COVID-19: Secondary | ICD-10-CM | POA: Diagnosis not present

## 2023-04-18 DIAGNOSIS — R7989 Other specified abnormal findings of blood chemistry: Secondary | ICD-10-CM | POA: Diagnosis present

## 2023-04-18 LAB — BASIC METABOLIC PANEL
Anion gap: 8 (ref 5–15)
BUN: 34 mg/dL — ABNORMAL HIGH (ref 8–23)
CO2: 20 mmol/L — ABNORMAL LOW (ref 22–32)
Calcium: 8.7 mg/dL — ABNORMAL LOW (ref 8.9–10.3)
Chloride: 105 mmol/L (ref 98–111)
Creatinine, Ser: 1.42 mg/dL — ABNORMAL HIGH (ref 0.61–1.24)
GFR, Estimated: 48 mL/min — ABNORMAL LOW (ref >60)
Glucose, Bld: 126 mg/dL — ABNORMAL HIGH (ref 70–99)
Potassium: 3.9 mmol/L (ref 3.5–5.1)
Sodium: 133 mmol/L — ABNORMAL LOW (ref 135–145)

## 2023-04-18 LAB — CBC
HCT: 45.6 % (ref 39.0–52.0)
Hemoglobin: 15.1 g/dL (ref 13.0–17.0)
MCH: 30.6 pg (ref 26.0–34.0)
MCHC: 33.1 g/dL (ref 30.0–36.0)
MCV: 92.3 fL (ref 80.0–100.0)
Platelets: 183 10*3/uL (ref 150–400)
RBC: 4.94 MIL/uL (ref 4.22–5.81)
RDW: 12.3 % (ref 11.5–15.5)
WBC: 11.4 10*3/uL — ABNORMAL HIGH (ref 4.0–10.5)
nRBC: 0 % (ref 0.0–0.2)

## 2023-04-18 LAB — MAGNESIUM: Magnesium: 1.9 mg/dL (ref 1.7–2.4)

## 2023-04-18 LAB — CORTISOL-AM, BLOOD: Cortisol - AM: 19.4 ug/dL (ref 6.7–22.6)

## 2023-04-18 LAB — MRSA NEXT GEN BY PCR, NASAL: MRSA by PCR Next Gen: NOT DETECTED

## 2023-04-18 LAB — PROTIME-INR
INR: 1.2 (ref 0.8–1.2)
Prothrombin Time: 15.4 s — ABNORMAL HIGH (ref 11.4–15.2)

## 2023-04-18 LAB — POTASSIUM: Potassium: 3.8 mmol/L (ref 3.5–5.1)

## 2023-04-18 MED ORDER — POTASSIUM CHLORIDE CRYS ER 20 MEQ PO TBCR
40.0000 meq | EXTENDED_RELEASE_TABLET | Freq: Once | ORAL | Status: AC
  Administered 2023-04-18: 40 meq via ORAL
  Filled 2023-04-18: qty 2

## 2023-04-18 MED ORDER — CHLORHEXIDINE GLUCONATE CLOTH 2 % EX PADS: 6.0000 | MEDICATED_PAD | Freq: Every day | CUTANEOUS | Status: DC

## 2023-04-18 MED ORDER — MAGNESIUM SULFATE 2 GM/50ML IV SOLN
2.0000 g | Freq: Once | INTRAVENOUS | Status: AC
  Administered 2023-04-18: 2 g via INTRAVENOUS
  Filled 2023-04-18: qty 50

## 2023-04-18 MED ORDER — TAMSULOSIN HCL 0.4 MG PO CAPS
0.4000 mg | ORAL_CAPSULE | Freq: Every day | ORAL | Status: DC
  Administered 2023-04-18 – 2023-04-20 (×3): 0.4 mg via ORAL
  Filled 2023-04-18 (×3): qty 1

## 2023-04-18 NOTE — Progress Notes (Addendum)
 Progress Note   Patient: Frank West:[id number] DOB: [birth year] DOA: 04/17/2023     0 DOS: the patient was seen and examined on 04/18/2023   Brief hospital admission narrative: As per H&P written by Dr. Arville Care on 04/17/23 Frank West is a 88 y.o. male with medical history significant for coronary artery disease status post PCI and stent and Alzheimer's dementia, who presented to the emergency room with acute onset of altered mental status with confusion, garbled none sensible speech and generalized weakness with 2 potential falls.  The patient was caught up during both falls and therefore had no injuries.  He has been having urinary frequency and urgency without dysuria or hematuria or flank pain.  No reported fever however has been having chills.  No nausea or vomiting or diarrhea or abdominal pain.  No chest pain or palpitations.  No cough or wheezing or hemoptysis.   ED Course: When he came to the ER within normal.  Labs revealed a BUN of 6 with a creatinine of 1.59, above previous levels.  CBC showed leukocytosis of 11.6.  Respiratory panel came back negative.  UA was positive for UTI. EKG as reviewed by me : EKG showed normal sinus rhythm with rate of 89 with multiple ventricular premature complexes and prolonged PR interval with left anterior fascicular block. Imaging: Portable chest x-ray showed hypoventilation with no acute cardiopulmonary disease.  Noncontrast head CT scan showed no acute intracranial normalities.  Assessment and Plan: * Acute metabolic encephalopathy - Patient with underlying history of dementia -Mentation worsening most likely in the setting of UTI -Continue constant reorientation, supportive care, adequate hydration and continue antibiotic therapy -Continue minimizing sedative agents.  Sepsis due to UTI - Patient met criteria time of admission with elevated WBCs, tachycardia/tachypnea concerns of infection his urine. -Follow culture result -Maintain  adequate hydration -Continue IV antibiotics  Urinary retention -Continue treatment for UTI -Flomax has been initiated -Follow response.  Acute kidney injury -Secondary to UTI and urinary retention most likely -Prerenal azotemia also playing a role. -Minimize nephrotoxic agents -Avoid hypotension and the use of contrast -Continue fluid resuscitation and follow renal function trend.  Dyslipidemia -Continue statin.  Subjective:  Afebrile, no chest pain, no nausea, no vomiting.  Complaining of increased frequency.  Physical Exam: Vitals:   04/18/23 1154 04/18/23 1611 04/18/23 1628 04/18/23 1726  BP:    137/87  Pulse:    71  Resp:    17  Temp: 97.8 F (36.6 C) 98 F (36.7 C) 97.6 F (36.4 C)   TempSrc: Oral Oral Oral Oral  SpO2:    99%  Weight:      Height:       General exam: Alert, awake, to person and place and following commands appropriately.  No fever.  Patient continue to demonstrate increased frequency and incapacity to empty his bladder completely (post residual voiding around 200). Respiratory system: No use of accessory muscles; good saturation on room air.  No tachypnea. Cardiovascular system: Rate controlled, no rubs, no gallops, no JVD. Gastrointestinal system: Abdomen is nondistended, soft and nontender.  Positive bowel sounds. Central nervous system: Moving 4 limbs spontaneously.  No focal neurological deficits. Extremities: No cyanosis or clubbing. Skin: No petechiae. Psychiatry: Judgement and insight appear impaired due to underlying dementia.   Data Reviewed: CBC: WBCs 11.4, hemoglobin 15.1 and platelet count 23K Basic metabolic panel: Sodium 133, potassium 3.9, chloride 105, bicarb 20, BUN 34, creatinine 1.42 and GFR 48 MRSA PCR: Not detected  Family Communication:  Patient's daughter updated over the phone.  Disposition: Status is: Inpatient Remains inpatient appropriate because: Continue IV antibiotics.   Planned Discharge Destination: Home with  Home Health  Time spent: 50 minutes  Author: Vassie Loll, MD 04/18/2023 6:08 PM  For on call review [email or web address].

## 2023-04-18 NOTE — Care Management Obs Status (Signed)
 MEDICARE OBSERVATION STATUS NOTIFICATION   Patient Details  Name: Frank West MRN: [id number] Date of Birth: 03-13-1935   Medicare Observation Status Notification Given:  Yes    Villa Herb, LCSWA 04/18/2023, 11:43 AM

## 2023-04-18 NOTE — Assessment & Plan Note (Signed)
-   This is like secondary to UTI. - The patient will be admitted to a medical observation bed. - Management otherwise as below.

## 2023-04-18 NOTE — TOC Initial Note (Signed)
 Transition of Care [facility]) - Initial/Assessment Note    Patient Details  Name: Frank West MRN: [id number] Date of Birth: March 02, 1935  Transition of Care [facility]) CM/SW Contact:    Villa Herb, LCSWA Phone Number: 04/18/2023, 11:44 AM  Clinical Narrative:                 CSW spoke with pts daughter to complete assessment. Pt lives with his daughter. Pt is able to do his ADLs mostly independently, however his daughter assists if needed. Pts daughter provides transportation when needed. Pt has had HH in the past, pts daughter thinks it was with [facility]. Pt does not use any DME in the home. TOC to follow.   Expected Discharge Plan: Home w Home Health Services Barriers to Discharge: Continued Medical Work up   Patient Goals and CMS Choice Patient states their goals for this hospitalization and ongoing recovery are:: return home CMS Medicare.gov Compare Post Acute Care list provided to:: Patient Represenative (must comment) Choice offered to / list presented to : Adult Children      Expected Discharge Plan and Services In-house Referral: Clinical Social Work Discharge Planning Services: CM Consult   Living arrangements for the past 2 months: Single Family Home                                      Prior Living Arrangements/Services Living arrangements for the past 2 months: Single Family Home Lives with:: Adult Children Patient language and need for interpreter reviewed:: Yes Do you feel safe going back to the place where you live?: Yes      Need for Family Participation in Patient Care: Yes (Comment) Care giver support system in place?: Yes (comment)   Criminal Activity/Legal Involvement Pertinent to Current Situation/Hospitalization: No - Comment as needed  Activities of Daily Living   ADL Screening (condition at time of admission) Is the patient deaf or have difficulty hearing?: Yes Does the patient have difficulty seeing, even when wearing  glasses/contacts?: No Does the patient have difficulty concentrating, remembering, or making decisions?: Yes  Permission Sought/Granted                  Emotional Assessment Appearance:: Appears stated age       Alcohol / Substance Use: Not Applicable Psych Involvement: No (comment)  Admission diagnosis:  Disorientation [R41.0] Acute cystitis without hematuria [N30.00] Generalized weakness [R53.1] Acute metabolic encephalopathy [G93.41] Patient Active Problem List   Diagnosis Date Noted   Sepsis due to gram-negative UTI (HCC) 04/18/2023   Dyslipidemia 04/18/2023   Acute metabolic encephalopathy 04/17/2023   Routine medical exam 01/05/2023   Encounter for screening prostate specific antigen (PSA) measurement 01/05/2023   Urinary incontinence without sensory awareness 01/05/2023   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   Alzheimer's disease with late onset (HCC) 10/26/2022   Chronic constipation 09/30/2022   Presence of right artificial hip joint 09/27/2022   Thrombocytopenia (HCC) 09/26/2022   Transaminitis 09/26/2022   Coronary artery disease without angina pectoris 09/26/2022   Closed subcapital fracture of neck of right femur (HCC) 09/23/2022   Alzheimer's dementia (HCC) 09/23/2022   Fall at home, initial encounter 09/23/2022   Cocaine use 03/11/2017   Benign essential hypertension 03/08/2017   GAD (generalized anxiety disorder) 02/16/2016   Mixed hyperlipidemia 02/16/2016   BPH (benign prostatic hyperplasia) 09/17/2015   Muscle weakness (generalized) 03/02/1999   Presence of coronary angioplasty implant and  graft 03/02/1999   PCP:  Martina Sinner, NP Pharmacy:   [facility] [address] [place] - [address] [place] [address] Phone: [phone] Fax: [phone]  [facility] [address] [place] - [address] [place] [address] [place] [address] [place] [address] Phone: [phone] Fax: [phone]     Social  Drivers of Health (SDOH) Social History: SDOH Screenings   Food Insecurity: No Food Insecurity (04/18/2023)  Housing: Low Risk  (04/18/2023)  Transportation Needs: No Transportation Needs (04/18/2023)  Utilities: Not At Risk (04/18/2023)  Depression (PHQ2-9): High Risk (01/05/2023)  Social Connections: Unknown (04/18/2023)  Tobacco Use: Medium Risk (04/17/2023)   SDOH Interventions:     Readmission Risk Interventions     No data to display

## 2023-04-18 NOTE — Assessment & Plan Note (Signed)
-   We will continue to pack therapy with IV Rocephin. - We will follow urine and blood cultures. - We will continue hydration with IV lactated ringer.

## 2023-04-18 NOTE — Plan of Care (Signed)

## 2023-04-18 NOTE — ED Provider Notes (Incomplete)
 [place] EMERGENCY DEPARTMENT AT [facility] Provider Note   CSN: [id number] Arrival date & time: 04/17/23  1757     History {Add pertinent medical, surgical, social history, OB history to HPI:1} No chief complaint on file.   Frank West is a 88 y.o. male with PMH as listed below who presents with ***.    Past Medical History:  Diagnosis Date  . Alzheimer disease (HCC)   . CAD (coronary artery disease)    a. s/p prior stent in 2017 but details unavailable       Home Medications Prior to Admission medications   Medication Sig Start Date End Date Taking? Authorizing Provider  ASPIRIN 81 PO 81 mg daily. 10/25/22   [provider]  atorvastatin (LIPITOR) 40 MG tablet Take 1 tablet (40 mg total) by mouth daily. 04/07/23 07/06/23  Antoine Poche, MD      Allergies    Patient has no known allergies.    Review of Systems   Review of Systems A 10 point review of systems was performed and is negative unless otherwise reported in HPI.  Physical Exam Updated Vital Signs BP 123/82 (BP Location: Left Arm)   Pulse 86   Temp 98.5 F (36.9 C) (Oral)   Resp 15   Ht 6' (1.829 m)   Wt 81.6 kg   SpO2 98%   BMI 24.41 kg/m  Physical Exam General: Normal appearing {Desc; male/male:11659}, lying in bed.  HEENT: PERRLA, Sclera anicteric, MMM, trachea midline.  Cardiology: RRR, no murmurs/rubs/gallops. BL radial and DP pulses equal bilaterally.  Resp: Normal respiratory rate and effort. CTAB, no wheezes, rhonchi, crackles.  Abd: Soft, non-tender, non-distended. No rebound tenderness or guarding.  GU: Deferred. MSK: No peripheral edema or signs of trauma. Extremities without deformity or TTP. No cyanosis or clubbing. Skin: warm, dry. No rashes or lesions. Back: No CVA tenderness Neuro: A&Ox4, CNs II-XII grossly intact. MAEs. Sensation grossly intact.  Psych: Normal mood and affect.   ED Results / Procedures / Treatments   Labs (all labs ordered are  listed, but only abnormal results are displayed) Labs Reviewed - No data to display  EKG None  Radiology No results found.  Procedures Procedures  {Document cardiac monitor, telemetry assessment procedure when appropriate:1}  Medications Ordered in ED Medications - No data to display  ED Course/ Medical Decision Making/ A&P                          Medical Decision Making Amount and/or Complexity of Data Reviewed Labs: ordered. Decision-making details documented in ED Course. Radiology: ordered. Decision-making details documented in ED Course.  Risk Decision regarding hospitalization.    This patient presents to the ED for concern of ***, this involves an extensive number of treatment options, and is a complaint that carries with it a high risk of complications and morbidity.  I considered the following differential and admission for this acute, potentially life threatening condition.   MDM:    ***  Clinical Course as of 04/18/23 0003  Sun Apr 17, 2023  2108 WBC(!): 11.6 +leukocytosis [HN]  2108 Glucose-Capillary(!): 140 Unremarkable in the context of this patient's presentation  [HN]  2135 Resp panel by RT-PCR (RSV, Flu A&B, Covid) Anterior Nasal Swab neg [HN]  2205 Urinalysis, Routine w reflex microscopic -Urine, Clean Catch(!) +UTI, will treat w/ IV ceftriaxone and admit to hospitalist [HN]  2205 CT Head Wo Contrast No acute intracranial abnormality. [HN]  Clinical Course User Index [HN] Loetta Rough, MD    Labs: I Ordered, and personally interpreted labs.  The pertinent results include:  ***  Imaging Studies ordered: I ordered imaging studies including *** I independently visualized and interpreted imaging. I agree with the radiologist interpretation  Additional history obtained from ***.  External records from outside source obtained and reviewed including ***  Cardiac Monitoring: .The patient was maintained on a cardiac monitor.  I personally  viewed and interpreted the cardiac monitored which showed an underlying rhythm of: ***  Reevaluation: After the interventions noted above, I reevaluated the patient and found that they have :{resolved/improved/worsened:23923::"improved"}  Social Determinants of Health: .***  Disposition:  ***  Co morbidities that complicate the patient evaluation . Past Medical History:  Diagnosis Date  . Alzheimer disease (HCC)   . CAD (coronary artery disease)    a. s/p prior stent in 2017 but details unavailable     Medicines No orders of the defined types were placed in this encounter.   I have reviewed the patients home medicines and have made adjustments as needed  Problem List / ED Course: Problem List Items Addressed This Visit   None        {Document critical care time when appropriate:1} {Document review of labs and clinical decision tools ie heart score, Chads2Vasc2 etc:1}  {Document your independent review of radiology images, and any outside records:1} {Document your discussion with family members, caretakers, and with consultants:1} {Document social determinants of health affecting pt's care:1} {Document your decision making why or why not admission, treatments were needed:1}  This note was created using dictation software, which may contain spelling or grammatical errors.

## 2023-04-18 NOTE — Assessment & Plan Note (Signed)
 -  We will continue statin therapy.

## 2023-04-18 NOTE — Plan of Care (Signed)
  Problem: Respiratory: Goal: Ability to maintain adequate ventilation will improve Outcome: Progressing   Problem: Elimination: Goal: Will not experience complications related to urinary retention Outcome: Progressing   Problem: Skin Integrity: Goal: Risk for impaired skin integrity will decrease Outcome: Progressing

## 2023-04-19 DIAGNOSIS — N3000 Acute cystitis without hematuria: Secondary | ICD-10-CM | POA: Diagnosis not present

## 2023-04-19 DIAGNOSIS — A419 Sepsis, unspecified organism: Secondary | ICD-10-CM | POA: Diagnosis not present

## 2023-04-19 DIAGNOSIS — G9341 Metabolic encephalopathy: Secondary | ICD-10-CM | POA: Diagnosis not present

## 2023-04-19 DIAGNOSIS — E785 Hyperlipidemia, unspecified: Secondary | ICD-10-CM | POA: Diagnosis not present

## 2023-04-19 LAB — BASIC METABOLIC PANEL
Anion gap: 11 (ref 5–15)
BUN: 30 mg/dL — ABNORMAL HIGH (ref 8–23)
CO2: 22 mmol/L (ref 22–32)
Calcium: 8.6 mg/dL — ABNORMAL LOW (ref 8.9–10.3)
Chloride: 104 mmol/L (ref 98–111)
Creatinine, Ser: 1.31 mg/dL — ABNORMAL HIGH (ref 0.61–1.24)
GFR, Estimated: 53 mL/min — ABNORMAL LOW (ref >60)
Glucose, Bld: 116 mg/dL — ABNORMAL HIGH (ref 70–99)
Potassium: 4.1 mmol/L (ref 3.5–5.1)
Sodium: 137 mmol/L (ref 135–145)

## 2023-04-19 LAB — CBC
HCT: 40.8 % (ref 39.0–52.0)
Hemoglobin: 13.4 g/dL (ref 13.0–17.0)
MCH: 30.8 pg (ref 26.0–34.0)
MCHC: 32.8 g/dL (ref 30.0–36.0)
MCV: 93.8 fL (ref 80.0–100.0)
Platelets: 152 10*3/uL (ref 150–400)
RBC: 4.35 MIL/uL (ref 4.22–5.81)
RDW: 12.3 % (ref 11.5–15.5)
WBC: 8.3 10*3/uL (ref 4.0–10.5)
nRBC: 0 % (ref 0.0–0.2)

## 2023-04-19 MED ORDER — SODIUM CHLORIDE 0.9 % IV SOLN: INTRAVENOUS | Status: AC

## 2023-04-19 NOTE — Progress Notes (Signed)
 Progress Note   Patient: Frank West ZOX:[id number] DOB: [birth year] DOA: 04/17/2023     1 DOS: the patient was seen and examined on 04/19/2023   Brief hospital admission narrative: As per H&P written by Dr. Arville Care on 04/17/23 Frank West is a 88 y.o. male with medical history significant for coronary artery disease status post PCI and stent and Alzheimer's dementia, who presented to the emergency room with acute onset of altered mental status with confusion, garbled none sensible speech and generalized weakness with 2 potential falls.  The patient was caught up during both falls and therefore had no injuries.  He has been having urinary frequency and urgency without dysuria or hematuria or flank pain.  No reported fever however has been having chills.  No nausea or vomiting or diarrhea or abdominal pain.  No chest pain or palpitations.  No cough or wheezing or hemoptysis.   ED Course: When he came to the ER within normal.  Labs revealed a BUN of 6 with a creatinine of 1.59, above previous levels.  CBC showed leukocytosis of 11.6.  Respiratory panel came back negative.  UA was positive for UTI. EKG as reviewed by me : EKG showed normal sinus rhythm with rate of 89 with multiple ventricular premature complexes and prolonged PR interval with left anterior fascicular block. Imaging: Portable chest x-ray showed hypoventilation with no acute cardiopulmonary disease.  Noncontrast head CT scan showed no acute intracranial normalities.  Assessment and Plan: * Acute metabolic encephalopathy - Patient with underlying history of dementia -Mentation worsening most likely in the setting of UTI -Continue constant reorientation, supportive care, adequate hydration and continue antibiotic therapy -Continue minimizing sedative agents. -Outpatient follow-up with PCP to determine the need of medication in case of developing behavioral disturbances and requiring placement (daughter feels she might not be able to  safely continue caring for him down the road)  Sepsis due to UTI - Patient met criteria time of admission with elevated WBCs, tachycardia/tachypnea concerns of infection his urine. -Maintain adequate hydration -Continue IV antibiotics and follow culture results.  Urinary retention -Continue treatment for UTI -Flomax has been initiated -Follow response.  Acute kidney injury -Secondary to UTI and urinary retention most likely -Prerenal azotemia also playing a role. -Minimize nephrotoxic agents -Avoid hypotension and the use of contrast -Continue fluid resuscitation and follow renal function trend; improving and close to baseline now.  Dyslipidemia -Continue statin.  Subjective:  No fever, no chest pain, no nausea, no vomiting.  Patient reports some improvement in urine frequency and is currently not having dysuria.  Physical Exam: Vitals:   04/18/23 2122 04/18/23 2319 04/19/23 0421 04/19/23 1356  BP: (!) 145/66 (!) 142/62 125/76 (!) 100/59  Pulse: 77 82  96  Resp: 18 18 18    Temp: 97.7 F (36.5 C) 97.8 F (36.6 C) 98.4 F (36.9 C) 97.8 F (36.6 C)  TempSrc: Oral Oral  Oral  SpO2: 100% 99% 99% 96%  Weight:  79.2 kg    Height:       General exam: Alert, awake, oriented x 2 intermittently; pleasantly confused and following commands.  No suprapubic tenderness, no nausea or vomiting. Respiratory system: Good saturation on room air. Cardiovascular system: Rate controlled, no rubs, no gallops, no JVD on exam. Gastrointestinal system: Abdomen is nondistended, soft and nontender. No organomegaly or masses felt. Normal bowel sounds heard. Central nervous system: Moving 4 limbs spontaneously.  No focal neurological deficits. Extremities: No cyanosis or clubbing. Skin: No petechiae. Psychiatry: Judgement and  insight appear impaired secondary to underlying dementia.  No suicidal ideation or hallucinations appreciated at time of examination.  Data Reviewed: CBC: WBCs 8.3,  hemoglobin 13.4 and platelet count 152K Basic metabolic panel: Sodium 137, potassium 4.1, chloride 104, bicarb 22, BUN 30, creatinine 1.31 and GFR 53. MRSA PCR: Not detected  Family Communication: Patient's daughter updated over the phone.  Disposition: Status is: Inpatient Remains inpatient appropriate because: Continue IV antibiotics.   Planned Discharge Destination: Home with Home Health  Time spent: 50 minutes  Author: Vassie Loll, MD 04/19/2023 6:53 PM  For on call review [email or web address].

## 2023-04-19 NOTE — Progress Notes (Signed)
 On call contacted overnight re: bigeminy/trigeminy. Stat labs, electrolytes replaced  Pt A&Ox self overnight, mood pleasant and following commands to verbally aggressive and cursing at times  Frequent urge to urinate, bed alarm alerting multiple times an hour. Pt forgetful, unable to remember to call staff or use urinal

## 2023-04-19 NOTE — Progress Notes (Signed)
 Mobility Specialist Progress Note:    04/19/23 1430  Mobility  Activity Ambulated with assistance in room  Level of Assistance Contact guard assist, steadying assist  Assistive Device None  Distance Ambulated (ft) 30 ft  Range of Motion/Exercises Active;All extremities  Activity Response Tolerated well  Mobility Referral Yes  Mobility visit 1 Mobility  Mobility Specialist Start Time (ACUTE ONLY) 1430  Mobility Specialist Stop Time (ACUTE ONLY) 1450  Mobility Specialist Time Calculation (min) (ACUTE ONLY) 20 min   Pt received in chair, agreeable to mobility. Required CGA to stand and ambulate with no AD. Tolerated well,asx throughout. Left pt in chair, alarm on. Call bell in reach, all needs met.  Aliesha Dolata Mobility Specialist Please contact via Special educational needs teacher or  Rehab office at [phone]

## 2023-04-20 DIAGNOSIS — G9341 Metabolic encephalopathy: Secondary | ICD-10-CM | POA: Diagnosis not present

## 2023-04-20 LAB — BASIC METABOLIC PANEL
Anion gap: 9 (ref 5–15)
BUN: 25 mg/dL — ABNORMAL HIGH (ref 8–23)
CO2: 21 mmol/L — ABNORMAL LOW (ref 22–32)
Calcium: 8.3 mg/dL — ABNORMAL LOW (ref 8.9–10.3)
Chloride: 107 mmol/L (ref 98–111)
Creatinine, Ser: 1.2 mg/dL (ref 0.61–1.24)
GFR, Estimated: 59 mL/min — ABNORMAL LOW (ref >60)
Glucose, Bld: 106 mg/dL — ABNORMAL HIGH (ref 70–99)
Potassium: 3.8 mmol/L (ref 3.5–5.1)
Sodium: 137 mmol/L (ref 135–145)

## 2023-04-20 NOTE — Progress Notes (Signed)
 Pt has been up in recliner since ambulating in hallway with PT staff. Pt has been cooperative and pleasant, talkative with staff. Some wandering of conversation at times but pt A&O x4 at this time. Denies any c/o, states he really hopes he gets to go home tomorrow because he misses his dog.  Pt voiding clear yellow urine, using urinal appropriately. Ate all of lunch, no swallowing difficulty or n/v. Currently watching TV and denies any needs. Chair alarm on for safety.

## 2023-04-20 NOTE — TOC Progression Note (Signed)
 Transition of Care [facility]) - Progression Note    Patient Details  Name: Frank West MRN: [id number] Date of Birth: [birth year]  Transition of Care [facility]) CM/SW Contact  [place], [place] Phone Number: 04/20/2023, 1:51 PM  Clinical Narrative:    CSW updated by MD that pts daughter is interested in placement. CSW explained that SNF referral can be sent out but cannot guarantee a SNF bed as pt will need memory care. CSW explained that pt did well with PT and will likely need to return home tomorrow with [facility] services. Pts daughter is understanding of plan. TOC to follow.   Expected Discharge Plan: Home w Home Health Services Barriers to Discharge: Continued Medical Work up  Expected Discharge Plan and Services In-house Referral: Clinical Social Work Discharge Planning Services: CM Consult   Living arrangements for the past 2 months: Single Family Home                                       Social Determinants of Health (SDOH) Interventions SDOH Screenings   Food Insecurity: No Food Insecurity (04/18/2023)  Housing: Low Risk  (04/18/2023)  Transportation Needs: No Transportation Needs (04/18/2023)  Utilities: Not At Risk (04/18/2023)  Depression (PHQ2-9): High Risk (01/05/2023)  Social Connections: Unknown (04/18/2023)  Tobacco Use: Medium Risk (04/17/2023)    Readmission Risk Interventions     No data to display

## 2023-04-20 NOTE — NC FL2 (Signed)
 [place] MEDICAID FL2 LEVEL OF CARE FORM     IDENTIFICATION  Patient Name: Frank West Birthdate: 20-Feb-1936 Sex: male Admission Date (Current Location): 04/17/2023  [facility] and [place] Number:  [employer] and Address:  [facility],  [address] [place] [address]      Provider Number: [id number]  Attending Physician Name and Address:  Erick Blinks, DO  Relative Name and Phone Number:  Juventino Slovak (Daughter)  [phone]    Current Level of Care: Hospital Recommended Level of Care: Skilled Nursing Facility, Memory Care Prior Approval Number:    Date Approved/Denied:   PASRR Number:    Discharge Plan: SNF    Current Diagnoses: Patient Active Problem List   Diagnosis Date Noted   Sepsis due to gram-negative UTI (HCC) 04/18/2023   Dyslipidemia 04/18/2023   Acute metabolic encephalopathy 04/17/2023   Routine medical exam 01/05/2023   Encounter for screening prostate specific antigen (PSA) measurement 01/05/2023   Urinary incontinence without sensory awareness 01/05/2023   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   Alzheimer's disease with late onset (HCC) 10/26/2022   Chronic constipation 09/30/2022   Presence of right artificial hip joint 09/27/2022   Thrombocytopenia (HCC) 09/26/2022   Transaminitis 09/26/2022   Coronary artery disease without angina pectoris 09/26/2022   Closed subcapital fracture of neck of right femur (HCC) 09/23/2022   Alzheimer's dementia (HCC) 09/23/2022   Fall at home, initial encounter 09/23/2022   Cocaine use 03/11/2017   Benign essential hypertension 03/08/2017   GAD (generalized anxiety disorder) 02/16/2016   Mixed hyperlipidemia 02/16/2016   BPH (benign prostatic hyperplasia) 09/17/2015   Muscle weakness (generalized) 03/02/1999   Presence of coronary angioplasty implant and graft 03/02/1999    Orientation RESPIRATION BLADDER Height & Weight     Self, Situation, Place  Normal Continent Weight: 174  lb 9.7 oz (79.2 kg) Height:  6' (182.9 cm)  BEHAVIORAL SYMPTOMS/MOOD NEUROLOGICAL BOWEL NUTRITION STATUS      Continent Diet (Heart healthy)  AMBULATORY STATUS COMMUNICATION OF NEEDS Skin   Extensive Assist Verbally Normal                       Personal Care Assistance Level of Assistance  Bathing, Dressing, Feeding   Feeding assistance: Independent Dressing Assistance: Limited assistance     Functional Limitations Info  Sight, Hearing, Speech Sight Info: Impaired Hearing Info: Adequate Speech Info: Adequate    SPECIAL CARE FACTORS FREQUENCY  PT (By licensed PT), OT (By licensed OT)     PT Frequency: 5 times weekly OT Frequency: 5 times weekly            Contractures Contractures Info: Not present    Additional Factors Info  Code Status, Allergies Code Status Info: FULL Allergies Info: NKA           Current Medications (04/20/2023):  This is the current hospital active medication list Current Facility-Administered Medications  Medication Dose Route Frequency Provider Last Rate Last Admin   acetaminophen (TYLENOL) tablet 650 mg  650 mg Oral Q6H PRN Mansy, Jan A, MD       Or   acetaminophen (TYLENOL) suppository 650 mg  650 mg Rectal Q6H PRN Mansy, Jan A, MD       atorvastatin (LIPITOR) tablet 40 mg  40 mg Oral Daily Mansy, Jan A, MD   40 mg at 04/20/23 1156   enoxaparin (LOVENOX) injection 40 mg  40 mg Subcutaneous Q24H Mansy, Jan A, MD   40 mg  at 04/20/23 1156   haloperidol lactate (HALDOL) injection 1 mg  1 mg Intramuscular Q6H PRN Mansy, Jan A, MD   1 mg at 04/18/23 0002   magnesium hydroxide (MILK OF MAGNESIA) suspension 30 mL  30 mL Oral Daily PRN Mansy, Jan A, MD       ondansetron [facility]) tablet 4 mg  4 mg Oral Q6H PRN Mansy, Jan A, MD       Or   ondansetron [facility]) injection 4 mg  4 mg Intravenous Q6H PRN Mansy, Jan A, MD       tamsulosin (FLOMAX) capsule 0.4 mg  0.4 mg Oral QPC supper Vassie Loll, MD   0.4 mg at 04/19/23 1802   traZODone  (DESYREL) tablet 25 mg  25 mg Oral QHS PRN Mansy, Jan A, MD   25 mg at 04/17/23 2341     Discharge Medications: Please see discharge summary for a list of discharge medications.  Relevant Imaging Results:  Relevant Lab Results:   Additional Information SSN: [id number] [place]

## 2023-04-20 NOTE — Care Management Important Message (Signed)
 Important Message  Patient Details  Name: Frank West MRN: [id number] Date of Birth: [birth year]   Important Message Given:  Yes - Medicare IM     Corey Harold 04/20/2023, 10:47 AM

## 2023-04-20 NOTE — Progress Notes (Signed)
 PROGRESS NOTE    Frank West  ZOX:[id number] DOB: 1935/07/27 DOA: 04/17/2023 PCP: Martina Sinner, NP   Brief Narrative:    Frank West is a 88 y.o. male with medical history significant for coronary artery disease status post PCI and stent and Alzheimer's dementia, who presented to the emergency room with acute onset of altered mental status with confusion, garbled none sensible speech and generalized weakness with 2 potential falls.  The patient was caught up during both falls and therefore had no injuries.   Assessment & Plan:   Principal Problem:   Acute metabolic encephalopathy Active Problems:   Sepsis due to gram-negative UTI (HCC)   Dyslipidemia  Assessment and Plan:  Acute metabolic encephalopathy - Patient with underlying history of dementia, likely progressive -Mentation worsening most likely in the setting of UTI -Continue constant reorientation, supportive care, adequate hydration and continue antibiotic therapy -Continue minimizing sedative agents. -Outpatient follow-up with PCP to determine the need of medication in case of developing behavioral disturbances and requiring placement (daughter feels she might not be able to safely continue caring for him down the road) -PT evaluation   Sepsis due to UTI - Patient met criteria time of admission with elevated WBCs, tachycardia/tachypnea concerns of infection his urine. -Maintain adequate hydration -Completed 3-day course of IV antibiotics, no urine culture results   Urinary retention -Continue treatment for UTI -Flomax has been initiated -Follow response.   Acute kidney injury -Secondary to UTI and urinary retention most likely -Prerenal azotemia also playing a role. -Minimize nephrotoxic agents -Avoid hypotension and the use of contrast -Continue fluid resuscitation and follow renal function trend; improving and close to baseline now.   Dyslipidemia -Continue statin.    DVT  prophylaxis:Lovenox Code Status: Full Family Communication: Daughter on phone 2/19 Disposition Plan:  Status is: Inpatient Remains inpatient appropriate because: Need for IV medications.   Consultants:  None  Procedures:  None  Antimicrobials:  Anti-infectives (From admission, onward)    Start     Dose/Rate Route Frequency Ordered Stop   04/17/23 2330  cefTRIAXone (ROCEPHIN) 2 g in sodium chloride 0.9 % 100 mL IVPB  Status:  Discontinued        2 g 200 mL/hr over 30 Minutes Intravenous Every 24 hours 04/17/23 2321 04/20/23 1117       Subjective: Patient seen and evaluated today with minimal ongoing confusion.  No acute overnight events noted.  Objective: Vitals:   04/19/23 0421 04/19/23 1356 04/19/23 2139 04/20/23 0527  BP: 125/76 (!) 100/59 (!) 135/96 134/85  Pulse:  96 93 70  Resp: 18  18 18   Temp: 98.4 F (36.9 C) 97.8 F (36.6 C) 98.3 F (36.8 C) 98.7 F (37.1 C)  TempSrc:  Oral  Oral  SpO2: 99% 96% 96% 99%  Weight:      Height:        Intake/Output Summary (Last 24 hours) at 04/20/2023 1118 Last data filed at 04/20/2023 0500 Gross per 24 hour  Intake 480 ml  Output --  Net 480 ml   Filed Weights   04/17/23 1808 04/18/23 2319  Weight: 81.6 kg 79.2 kg    Examination:  General exam: Appears calm and comfortable  Respiratory system: Clear to auscultation. Respiratory effort normal. Cardiovascular system: S1 & S2 heard, RRR.  Gastrointestinal system: Abdomen is soft Central nervous system: Alert and awake Extremities: No edema Skin: No significant lesions noted Psychiatry: Flat affect.    Data Reviewed: I have personally reviewed following labs  and imaging studies  CBC: Recent Labs  Lab 04/17/23 2045 04/18/23 0507 04/19/23 0348  WBC 11.6* 11.4* 8.3  NEUTROABS 7.5  --   --   HGB 16.3 15.1 13.4  HCT 50.8 45.6 40.8  MCV 94.1 92.3 93.8  PLT 178 183 152   Basic Metabolic Panel: Recent Labs  Lab 04/17/23 2045 04/18/23 0507  04/18/23 2239 04/19/23 0348 04/20/23 0400  NA 135 133*  --  137 137  K 3.8 3.9 3.8 4.1 3.8  CL 101 105  --  104 107  CO2 22 20*  --  22 21*  GLUCOSE 128* 126*  --  116* 106*  BUN 36* 34*  --  30* 25*  CREATININE 1.59* 1.42*  --  1.31* 1.20  CALCIUM 9.3 8.7*  --  8.6* 8.3*  MG 2.1  --  1.9  --   --    GFR: Estimated Creatinine Clearance: 48.5 mL/min (by C-G formula based on SCr of 1.2 mg/dL). Liver Function Tests: Recent Labs  Lab 04/17/23 2045  AST 37  ALT 40  ALKPHOS 73  BILITOT 1.7*  PROT 7.7  ALBUMIN 3.7   No results for input(s): "LIPASE", "AMYLASE" in the last 168 hours. No results for input(s): "AMMONIA" in the last 168 hours. Coagulation Profile: Recent Labs  Lab 04/18/23 0507  INR 1.2   Cardiac Enzymes: No results for input(s): "CKTOTAL", "CKMB", "CKMBINDEX", "TROPONINI" in the last 168 hours. BNP (last 3 results) No results for input(s): "PROBNP" in the last 8760 hours. HbA1C: No results for input(s): "HGBA1C" in the last 72 hours. CBG: Recent Labs  Lab 04/17/23 1929  GLUCAP 140*   Lipid Profile: No results for input(s): "CHOL", "HDL", "LDLCALC", "TRIG", "CHOLHDL", "LDLDIRECT" in the last 72 hours. Thyroid Function Tests: No results for input(s): "TSH", "T4TOTAL", "FREET4", "T3FREE", "THYROIDAB" in the last 72 hours. Anemia Panel: No results for input(s): "VITAMINB12", "FOLATE", "FERRITIN", "TIBC", "IRON", "RETICCTPCT" in the last 72 hours. Sepsis Labs: No results for input(s): "PROCALCITON", "LATICACIDVEN" in the last 168 hours.  Recent Results (from the past 240 hours)  Resp panel by RT-PCR (RSV, Flu A&B, Covid) Anterior Nasal Swab     Status: None   Collection Time: 04/17/23  8:02 PM   Specimen: Anterior Nasal Swab  Result Value Ref Range Status   SARS Coronavirus 2 by RT PCR NEGATIVE NEGATIVE Final    Comment: (NOTE) SARS-CoV-2 target nucleic acids are NOT DETECTED.  The SARS-CoV-2 RNA is generally detectable in upper  respiratory specimens during the acute phase of infection. The lowest concentration of SARS-CoV-2 viral copies this assay can detect is 138 copies/mL. A negative result does not preclude SARS-Cov-2 infection and should not be used as the sole basis for treatment or other patient management decisions. A negative result may occur with  improper specimen collection/handling, submission of specimen other than nasopharyngeal swab, presence of viral mutation(s) within the areas targeted by this assay, and inadequate number of viral copies(<138 copies/mL). A negative result must be combined with clinical observations, patient history, and epidemiological information. The expected result is Negative.  Fact Sheet for Patients:  BloggerCourse.com  Fact Sheet for Healthcare Providers:  SeriousBroker.it  This test is no t yet approved or cleared by the [place] FDA and  has been authorized for detection and/or diagnosis of SARS-CoV-2 by FDA under an Emergency Use Authorization (EUA). This EUA will remain  in effect (meaning this test can be used) for the duration of the COVID-19 declaration under Section 564(b)(1) of  the Act, 21 U.S.C.section 360bbb-3(b)(1), unless the authorization is terminated  or revoked sooner.       Influenza A by PCR NEGATIVE NEGATIVE Final   Influenza B by PCR NEGATIVE NEGATIVE Final    Comment: (NOTE) The Xpert Xpress SARS-CoV-2/FLU/RSV plus assay is intended as an aid in the diagnosis of influenza from Nasopharyngeal swab specimens and should not be used as a sole basis for treatment. Nasal washings and aspirates are unacceptable for Xpert Xpress SARS-CoV-2/FLU/RSV testing.  Fact Sheet for Patients: BloggerCourse.com  Fact Sheet for Healthcare Providers: SeriousBroker.it  This test is not yet approved or cleared by the [place] FDA and has been  authorized for detection and/or diagnosis of SARS-CoV-2 by FDA under an Emergency Use Authorization (EUA). This EUA will remain in effect (meaning this test can be used) for the duration of the COVID-19 declaration under Section 564(b)(1) of the Act, 21 U.S.C. section 360bbb-3(b)(1), unless the authorization is terminated or revoked.     Resp Syncytial Virus by PCR NEGATIVE NEGATIVE Final    Comment: (NOTE) Fact Sheet for Patients: BloggerCourse.com  Fact Sheet for Healthcare Providers: SeriousBroker.it  This test is not yet approved or cleared by the [place] FDA and has been authorized for detection and/or diagnosis of SARS-CoV-2 by FDA under an Emergency Use Authorization (EUA). This EUA will remain in effect (meaning this test can be used) for the duration of the COVID-19 declaration under Section 564(b)(1) of the Act, 21 U.S.C. section 360bbb-3(b)(1), unless the authorization is terminated or revoked.  Performed at [facility], [address] [place], [place] [address]   MRSA Next Gen by PCR, Nasal     Status: None   Collection Time: 04/18/23  2:50 AM   Specimen: Nasal Mucosa; Nasal Swab  Result Value Ref Range Status   MRSA by PCR Next Gen NOT DETECTED NOT DETECTED Final    Comment: (NOTE) The GeneXpert MRSA Assay (FDA approved for NASAL specimens only), is one component of a comprehensive MRSA colonization surveillance program. It is not intended to diagnose MRSA infection nor to guide or monitor treatment for MRSA infections. Test performance is not FDA approved in patients less than 4 years old. Performed at [facility], [address] [place], [place] [address]          Radiology Studies: No results found.      Scheduled Meds:  atorvastatin  40 mg Oral Daily   enoxaparin (LOVENOX) injection  40 mg Subcutaneous Q24H   tamsulosin  0.4 mg Oral QPC supper     LOS: 2 days    Time spent: 35  minutes    Soliyana Mcchristian Hoover Brunette, DO [facility]  If 7PM-7AM, please contact night-coverage [email or web address] 04/20/2023, 11:18 AM

## 2023-04-20 NOTE — Evaluation (Signed)
 Physical Therapy Evaluation Patient Details Name: Frank West MRN: [id number] DOB: Sep 06, 1935 Today's Date: 04/20/2023  History of Present Illness  Frank West is a 88 y.o. male with medical history significant for coronary artery disease status post PCI and stent and Alzheimer's dementia, who presented to the emergency room with acute onset of altered mental status with confusion, garbled none sensible speech and generalized weakness with 2 potential falls.  The patient was caught up during both falls and therefore had no injuries.  He has been having urinary frequency and urgency without dysuria or hematuria or flank pain.  No reported fever however has been having chills.  No nausea or vomiting or diarrhea or abdominal pain.  No chest pain or palpitations.  No cough or wheezing or hemoptysis  Clinical Impression  PT is I in bed mobility. Unsteady without a rolling walker but states that he has a rolling walker at home.  Pt ambulated over 200 ft with RW with no lose of balance or complaints. PT appears to be a prior level with no skilled therapy recommended.          If plan is discharge home, recommend the following: Help with stairs or ramp for entrance   Can travel by private vehicle    yes    Equipment Recommendations None recommended by PT  Recommendations for Other Services       Functional Status Assessment Patient has had a recent decline in their functional status and demonstrates the ability to make significant improvements in function in a reasonable and predictable amount of time.     Precautions / Restrictions Precautions Precautions: None Restrictions Weight Bearing Restrictions Per Provider Order: No      Mobility  Bed Mobility Overal bed mobility: Independent                  Transfers Overall transfer level: Independent                      Ambulation/Gait Ambulation/Gait assistance: Modified independent (Device/Increase time) Gait  Distance (Feet): 250 Feet Assistive device: Rolling walker (2 wheels) Gait Pattern/deviations: WFL(Within Functional Limits)              Pertinent Vitals/Pain Pain Assessment Pain Assessment: No/denies pain    Home Living Family/patient expects to be discharged to:: Private residence Living Arrangements: Children Available Help at Discharge: Family Type of Home: House Home Access: Stairs to enter Entrance Stairs-Rails: Can reach both Entrance Stairs-Number of Steps: 2   Home Layout: One level Home Equipment: Agricultural consultant (2 wheels) Additional Comments: Pt lives at home with stepdaughter, has family to assist during the day    Prior Function Prior Level of Function : Independent/Modified Independent                     Extremity/Trunk Assessment        Lower Extremity Assessment Lower Extremity Assessment: Overall WFL for tasks assessed       Communication   Communication Communication: No apparent difficulties    Cognition Arousal: Alert     PT - Cognitive impairments: History of cognitive impairments                         Following commands: Intact       Cueing Cueing Techniques: Verbal cues            Assessment/Plan    PT Assessment Patient does not  need any further PT services     PT Treatment Interventions      PT Goals (Current goals can be found in the Care Plan section)  Acute Rehab PT Goals Patient Stated Goal: to go home PT Goal Formulation: With patient Time For Goal Achievement: 04/20/23 Potential to Achieve Goals: Good            AM-PAC PT "6 Clicks" Mobility  Outcome Measure Help needed turning from your back to your side while in a flat bed without using bedrails?: None Help needed moving from lying on your back to sitting on the side of a flat bed without using bedrails?: None Help needed moving to and from a bed to a chair (including a wheelchair)?: None Help needed standing up from a chair  using your arms (e.g., wheelchair or bedside chair)?: None Help needed to walk in hospital room?: A Little Help needed climbing 3-5 steps with a railing? : A Little 6 Click Score: 22    End of Session Equipment Utilized During Treatment: Gait belt Activity Tolerance: Patient tolerated treatment well Patient left: in chair;with nursing/sitter in room;with call bell/phone within reach;with chair alarm set Nurse Communication: Mobility status PT Visit Diagnosis: Unsteadiness on feet (R26.81)    Time: 1130-1159 PT Time Calculation (min) (ACUTE ONLY): 29 min   Charges:   PT Evaluation $PT Eval Low Complexity: 1 Low   PT General Charges $$ ACUTE PT VISIT: 1 Visit    Virgina Organ, PT CLT [phone]     04/20/2023, 12:01 PM

## 2023-04-20 NOTE — Progress Notes (Signed)
 Pt awake, sitting up in bed eating breakfast. Pt requested to call his daughter. Number dialed and pt currently talking with daughter. Pt can state his name and birthday and knows he's at [facility], but disoriented to time and situation. He is very concerned about going home and about his dog. Tearful at times, otherwise cooperative.

## 2023-04-20 NOTE — Plan of Care (Signed)

## 2023-04-21 DIAGNOSIS — G9341 Metabolic encephalopathy: Secondary | ICD-10-CM | POA: Diagnosis not present

## 2023-04-21 LAB — BASIC METABOLIC PANEL
Anion gap: 8 (ref 5–15)
BUN: 24 mg/dL — ABNORMAL HIGH (ref 8–23)
CO2: 23 mmol/L (ref 22–32)
Calcium: 8.3 mg/dL — ABNORMAL LOW (ref 8.9–10.3)
Chloride: 104 mmol/L (ref 98–111)
Creatinine, Ser: 1.24 mg/dL (ref 0.61–1.24)
GFR, Estimated: 57 mL/min — ABNORMAL LOW (ref >60)
Glucose, Bld: 123 mg/dL — ABNORMAL HIGH (ref 70–99)
Potassium: 3.8 mmol/L (ref 3.5–5.1)
Sodium: 135 mmol/L (ref 135–145)

## 2023-04-21 LAB — MAGNESIUM: Magnesium: 2.1 mg/dL (ref 1.7–2.4)

## 2023-04-21 MED ORDER — TAMSULOSIN HCL 0.4 MG PO CAPS: 0.4000 mg | ORAL_CAPSULE | Freq: Every day | ORAL | 0 refills | Status: DC

## 2023-04-21 MED ORDER — QUETIAPINE FUMARATE 25 MG PO TABS: 25.0000 mg | ORAL_TABLET | Freq: Every day | ORAL | 0 refills | Status: DC

## 2023-04-21 NOTE — Discharge Summary (Signed)
 Physician Discharge Summary  Frank West HQI:[id number] DOB: May 19, 1935 DOA: 04/17/2023  PCP: Martina Sinner, NP  Admit date: 04/17/2023  Discharge date: 04/21/2023  Admitted From:Home  Disposition:  SNF with memory care  Recommendations for Outpatient Follow-up:  Follow up with PCP in 1-2 weeks Completed course of antibiotics for UTI Continue on Flomax for BPH Start Seroquel for behavioral issues related to dementia  Home Health: None  Equipment/Devices: None  Discharge Condition:Stable  CODE STATUS: Full  Diet recommendation: Heart Healthy  Brief/Interim Summary: Frank West is a 88 y.o. male with medical history significant for coronary artery disease status post PCI and stent and Alzheimer's dementia, who presented to the emergency room with acute onset of altered mental status with confusion, garbled none sensible speech and generalized weakness with 2 potential falls.  The patient was caught up during both falls and therefore had no injuries.  He was admitted with acute metabolic encephalopathy in the setting of sepsis, POA, secondary to UTI.  He has completed course of antibiotics while admitted.  He is also noted to have issues with urinary retention and was started on Flomax.  His confusion has improved, and he has returned back to baseline but does have some significant confusion at baseline to with his dementia.  He does have some mood issues as well as behavioral disturbances for which Seroquel has been initiated.  Discharge Diagnoses:  Principal Problem:   Acute metabolic encephalopathy Active Problems:   Sepsis due to gram-negative UTI (HCC)   Dyslipidemia  Principal discharge diagnosis: Acute metabolic encephalopathy secondary to sepsis, POA with UTI.  Discharge Instructions  Discharge Instructions     Diet - low sodium heart healthy   Complete by: As directed    Increase activity slowly   Complete by: As directed       Allergies as of  04/21/2023   No Known Allergies      Medication List     TAKE these medications    ASPIRIN 81 PO Take 81 mg by mouth daily.   atorvastatin 40 MG tablet Commonly known as: LIPITOR Take 1 tablet (40 mg total) by mouth daily.   QUEtiapine 25 MG tablet Commonly known as: SEROquel Take 1 tablet (25 mg total) by mouth at bedtime.   tamsulosin 0.4 MG Caps capsule Commonly known as: FLOMAX Take 1 capsule (0.4 mg total) by mouth daily after supper.        Follow-up Information     [facility], Dois Davenport, NP. Schedule an appointment as soon as possible for a visit in 1 week(s).   Specialty: Nurse Practitioner Contact information: [address] [place] [address] [phone]                No Known Allergies  Consultations: None   Procedures/Studies: CT Head Wo Contrast Result Date: 04/17/2023 CLINICAL DATA:  Mental status change, unknown cause EXAM: CT HEAD WITHOUT CONTRAST TECHNIQUE: Contiguous axial images were obtained from the base of the skull through the vertex without intravenous contrast. RADIATION DOSE REDUCTION: This exam was performed according to the departmental dose-optimization program which includes automated exposure control, adjustment of the mA and/or kV according to patient size and/or use of iterative reconstruction technique. COMPARISON:  09/23/2022 FINDINGS: Brain: No intracranial hemorrhage, mass effect, or midline shift. Stable degree of atrophy and chronic small vessel ischemia. No hydrocephalus. The basilar cisterns are patent. No evidence of territorial infarct or acute ischemia. No extra-axial or intracranial fluid collection. Vascular: Atherosclerosis  of skullbase vasculature without hyperdense vessel or abnormal calcification. Skull: No fracture or focal lesion. Sinuses/Orbits: Paranasal sinuses and mastoid air cells are clear. The visualized orbits are unremarkable. Other: None. IMPRESSION: No acute intracranial abnormality.  Electronically Signed   By: Narda Rutherford M.D.   On: 04/17/2023 21:42   DG Chest Portable 1 View Result Date: 04/17/2023 CLINICAL DATA:  Altered mental status.  Tachypnea. EXAM: PORTABLE CHEST 1 VIEW COMPARISON:  07/17/2019 FINDINGS: Low lung volumes.The cardiomediastinal contours are normal. Pulmonary vasculature is normal. No consolidation, pleural effusion, or pneumothorax. No acute osseous abnormalities are seen. IMPRESSION: Hypoventilatory chest without acute findings. Electronically Signed   By: Narda Rutherford M.D.   On: 04/17/2023 21:33     Discharge Exam: Vitals:   04/20/23 2247 04/21/23 0555  BP: (!) 109/95 133/83  Pulse: 84 65  Resp: 18 18  Temp:  (!) 97.3 F (36.3 C)  SpO2: 98% 97%   Vitals:   04/20/23 0527 04/20/23 1446 04/20/23 2247 04/21/23 0555  BP: 134/85 132/76 (!) 109/95 133/83  Pulse: 70 71 84 65  Resp: 18 18 18 18   Temp: 98.7 F (37.1 C) 98.6 F (37 C)  (!) 97.3 F (36.3 C)  TempSrc: Oral Oral  Oral  SpO2: 99% 98% 98% 97%  Weight:      Height:        General: Pt is alert, awake, not in acute distress Cardiovascular: RRR, S1/S2 +, no rubs, no gallops Respiratory: CTA bilaterally, no wheezing, no rhonchi Abdominal: Soft, NT, ND, bowel sounds + Extremities: no edema, no cyanosis    The results of significant diagnostics from this hospitalization (including imaging, microbiology, ancillary and laboratory) are listed below for reference.     Microbiology: Recent Results (from the past 240 hours)  Resp panel by RT-PCR (RSV, Flu A&B, Covid) Anterior Nasal Swab     Status: None   Collection Time: 04/17/23  8:02 PM   Specimen: Anterior Nasal Swab  Result Value Ref Range Status   SARS Coronavirus 2 by RT PCR NEGATIVE NEGATIVE Final    Comment: (NOTE) SARS-CoV-2 target nucleic acids are NOT DETECTED.  The SARS-CoV-2 RNA is generally detectable in upper respiratory specimens during the acute phase of infection. The lowest concentration of SARS-CoV-2  viral copies this assay can detect is 138 copies/mL. A negative result does not preclude SARS-Cov-2 infection and should not be used as the sole basis for treatment or other patient management decisions. A negative result may occur with  improper specimen collection/handling, submission of specimen other than nasopharyngeal swab, presence of viral mutation(s) within the areas targeted by this assay, and inadequate number of viral copies(<138 copies/mL). A negative result must be combined with clinical observations, patient history, and epidemiological information. The expected result is Negative.  Fact Sheet for Patients:  BloggerCourse.com  Fact Sheet for Healthcare Providers:  SeriousBroker.it  This test is no t yet approved or cleared by the [place] FDA and  has been authorized for detection and/or diagnosis of SARS-CoV-2 by FDA under an Emergency Use Authorization (EUA). This EUA will remain  in effect (meaning this test can be used) for the duration of the COVID-19 declaration under Section 564(b)(1) of the Act, 21 U.S.C.section 360bbb-3(b)(1), unless the authorization is terminated  or revoked sooner.       Influenza A by PCR NEGATIVE NEGATIVE Final   Influenza B by PCR NEGATIVE NEGATIVE Final    Comment: (NOTE) The Xpert Xpress SARS-CoV-2/FLU/RSV plus assay is intended as an aid  in the diagnosis of influenza from Nasopharyngeal swab specimens and should not be used as a sole basis for treatment. Nasal washings and aspirates are unacceptable for Xpert Xpress SARS-CoV-2/FLU/RSV testing.  Fact Sheet for Patients: BloggerCourse.com  Fact Sheet for Healthcare Providers: SeriousBroker.it  This test is not yet approved or cleared by the [place] FDA and has been authorized for detection and/or diagnosis of SARS-CoV-2 by FDA under an Emergency Use Authorization  (EUA). This EUA will remain in effect (meaning this test can be used) for the duration of the COVID-19 declaration under Section 564(b)(1) of the Act, 21 U.S.C. section 360bbb-3(b)(1), unless the authorization is terminated or revoked.     Resp Syncytial Virus by PCR NEGATIVE NEGATIVE Final    Comment: (NOTE) Fact Sheet for Patients: BloggerCourse.com  Fact Sheet for Healthcare Providers: SeriousBroker.it  This test is not yet approved or cleared by the [place] FDA and has been authorized for detection and/or diagnosis of SARS-CoV-2 by FDA under an Emergency Use Authorization (EUA). This EUA will remain in effect (meaning this test can be used) for the duration of the COVID-19 declaration under Section 564(b)(1) of the Act, 21 U.S.C. section 360bbb-3(b)(1), unless the authorization is terminated or revoked.  Performed at [facility], [address], [place], [place] [address]   MRSA Next Gen by PCR, Nasal     Status: None   Collection Time: 04/18/23  2:50 AM   Specimen: Nasal Mucosa; Nasal Swab  Result Value Ref Range Status   MRSA by PCR Next Gen NOT DETECTED NOT DETECTED Final    Comment: (NOTE) The GeneXpert MRSA Assay (FDA approved for NASAL specimens only), is one component of a comprehensive MRSA colonization surveillance program. It is not intended to diagnose MRSA infection nor to guide or monitor treatment for MRSA infections. Test performance is not FDA approved in patients less than 47 years old. Performed at [facility], [address], [place], [place] [address]      Labs: BNP (last 3 results) No results for input(s): "BNP" in the last 8760 hours. Basic Metabolic Panel: Recent Labs  Lab 04/17/23 2045 04/18/23 0507 04/18/23 2239 04/19/23 0348 04/20/23 0400 04/21/23 0326  NA 135 133*  --  137 137 135  K 3.8 3.9 3.8 4.1 3.8 3.8  CL 101 105  --  104 107 104  CO2 22 20*  --  22 21* 23  GLUCOSE 128*  126*  --  116* 106* 123*  BUN 36* 34*  --  30* 25* 24*  CREATININE 1.59* 1.42*  --  1.31* 1.20 1.24  CALCIUM 9.3 8.7*  --  8.6* 8.3* 8.3*  MG 2.1  --  1.9  --   --  2.1   Liver Function Tests: Recent Labs  Lab 04/17/23 2045  AST 37  ALT 40  ALKPHOS 73  BILITOT 1.7*  PROT 7.7  ALBUMIN 3.7   No results for input(s): "LIPASE", "AMYLASE" in the last 168 hours. No results for input(s): "AMMONIA" in the last 168 hours. CBC: Recent Labs  Lab 04/17/23 2045 04/18/23 0507 04/19/23 0348  WBC 11.6* 11.4* 8.3  NEUTROABS 7.5  --   --   HGB 16.3 15.1 13.4  HCT 50.8 45.6 40.8  MCV 94.1 92.3 93.8  PLT 178 183 152   Cardiac Enzymes: No results for input(s): "CKTOTAL", "CKMB", "CKMBINDEX", "TROPONINI" in the last 168 hours. BNP: Invalid input(s): "POCBNP" CBG: Recent Labs  Lab 04/17/23 1929  GLUCAP 140*   D-Dimer No results  for input(s): "DDIMER" in the last 72 hours. Hgb A1c No results for input(s): "HGBA1C" in the last 72 hours. Lipid Profile No results for input(s): "CHOL", "HDL", "LDLCALC", "TRIG", "CHOLHDL", "LDLDIRECT" in the last 72 hours. Thyroid function studies No results for input(s): "TSH", "T4TOTAL", "T3FREE", "THYROIDAB" in the last 72 hours.  Invalid input(s): "FREET3" Anemia work up No results for input(s): "VITAMINB12", "FOLATE", "FERRITIN", "TIBC", "IRON", "RETICCTPCT" in the last 72 hours. Urinalysis    Component Value Date/Time   COLORURINE YELLOW 04/17/2023 2024   APPEARANCEUR TURBID (A) 04/17/2023 2024   LABSPEC 1.014 04/17/2023 2024   PHURINE 5.0 04/17/2023 2024   GLUCOSEU NEGATIVE 04/17/2023 2024   HGBUR MODERATE (A) 04/17/2023 2024   BILIRUBINUR NEGATIVE 04/17/2023 2024   KETONESUR NEGATIVE 04/17/2023 2024   PROTEINUR 100 (A) 04/17/2023 2024   NITRITE NEGATIVE 04/17/2023 2024   LEUKOCYTESUR MODERATE (A) 04/17/2023 2024   Sepsis Labs Recent Labs  Lab 04/17/23 2045 04/18/23 0507 04/19/23 0348  WBC 11.6* 11.4* 8.3   Microbiology Recent  Results (from the past 240 hours)  Resp panel by RT-PCR (RSV, Flu A&B, Covid) Anterior Nasal Swab     Status: None   Collection Time: 04/17/23  8:02 PM   Specimen: Anterior Nasal Swab  Result Value Ref Range Status   SARS Coronavirus 2 by RT PCR NEGATIVE NEGATIVE Final    Comment: (NOTE) SARS-CoV-2 target nucleic acids are NOT DETECTED.  The SARS-CoV-2 RNA is generally detectable in upper respiratory specimens during the acute phase of infection. The lowest concentration of SARS-CoV-2 viral copies this assay can detect is 138 copies/mL. A negative result does not preclude SARS-Cov-2 infection and should not be used as the sole basis for treatment or other patient management decisions. A negative result may occur with  improper specimen collection/handling, submission of specimen other than nasopharyngeal swab, presence of viral mutation(s) within the areas targeted by this assay, and inadequate number of viral copies(<138 copies/mL). A negative result must be combined with clinical observations, patient history, and epidemiological information. The expected result is Negative.  Fact Sheet for Patients:  BloggerCourse.com  Fact Sheet for Healthcare Providers:  SeriousBroker.it  This test is no t yet approved or cleared by the [place] FDA and  has been authorized for detection and/or diagnosis of SARS-CoV-2 by FDA under an Emergency Use Authorization (EUA). This EUA will remain  in effect (meaning this test can be used) for the duration of the COVID-19 declaration under Section 564(b)(1) of the Act, 21 U.S.C.section 360bbb-3(b)(1), unless the authorization is terminated  or revoked sooner.       Influenza A by PCR NEGATIVE NEGATIVE Final   Influenza B by PCR NEGATIVE NEGATIVE Final    Comment: (NOTE) The Xpert Xpress SARS-CoV-2/FLU/RSV plus assay is intended as an aid in the diagnosis of influenza from Nasopharyngeal swab  specimens and should not be used as a sole basis for treatment. Nasal washings and aspirates are unacceptable for Xpert Xpress SARS-CoV-2/FLU/RSV testing.  Fact Sheet for Patients: BloggerCourse.com  Fact Sheet for Healthcare Providers: SeriousBroker.it  This test is not yet approved or cleared by the [place] FDA and has been authorized for detection and/or diagnosis of SARS-CoV-2 by FDA under an Emergency Use Authorization (EUA). This EUA will remain in effect (meaning this test can be used) for the duration of the COVID-19 declaration under Section 564(b)(1) of the Act, 21 U.S.C. section 360bbb-3(b)(1), unless the authorization is terminated or revoked.     Resp Syncytial Virus by PCR NEGATIVE  NEGATIVE Final    Comment: (NOTE) Fact Sheet for Patients: BloggerCourse.com  Fact Sheet for Healthcare Providers: SeriousBroker.it  This test is not yet approved or cleared by the [place] FDA and has been authorized for detection and/or diagnosis of SARS-CoV-2 by FDA under an Emergency Use Authorization (EUA). This EUA will remain in effect (meaning this test can be used) for the duration of the COVID-19 declaration under Section 564(b)(1) of the Act, 21 U.S.C. section 360bbb-3(b)(1), unless the authorization is terminated or revoked.  Performed at [facility], [address], [place], [place] [address]   MRSA Next Gen by PCR, Nasal     Status: None   Collection Time: 04/18/23  2:50 AM   Specimen: Nasal Mucosa; Nasal Swab  Result Value Ref Range Status   MRSA by PCR Next Gen NOT DETECTED NOT DETECTED Final    Comment: (NOTE) The GeneXpert MRSA Assay (FDA approved for NASAL specimens only), is one component of a comprehensive MRSA colonization surveillance program. It is not intended to diagnose MRSA infection nor to guide or monitor treatment for MRSA infections. Test  performance is not FDA approved in patients less than 81 years old. Performed at [facility], [address] [place], [place] [address]      Time coordinating discharge: 35 minutes  SIGNED:   Erick Blinks, DO [facility] 04/21/2023, 10:28 AM  If 7PM-7AM, please contact night-coverage [email or web address]

## 2023-04-21 NOTE — Progress Notes (Signed)
 Pt discharged via WC by [facility] to [facility]. Pt's daughter aware of discharge and transport. Pt agreeable to transport. Pt's clothing and adult diapers sent with him to SNF.

## 2023-04-21 NOTE — TOC Transition Note (Signed)
 Transition of Care [facility]) - Discharge Note  Patient Details  Name: Frank West MRN: [id number] Date of Birth: [birth year]  [facility] CM/SW Contact:  Villa Herb, LCSWA Phone Number: 04/21/2023, 12:04 PM  Clinical Narrative:    CSW spoke to pts daughter about bed offer from [facility], she accepts. CSW met with pt at bedside about SNF placement, pt is reluctant but agreeable. CSW spoke to [facility] in admissions who states they have a male bed in memory care for pt and can accept today. CSW updated MD and RN. D/C clinicals sent to facility via HUB. CSW provided RN with room and report. [facility] called for transport. Pts daughter updated on plan. TOC to follow.   Final next level of care: Skilled Nursing Facility Barriers to Discharge: Barriers Resolved   Patient Goals and CMS Choice Patient states their goals for this hospitalization and ongoing recovery are:: Go to SNF CMS Medicare.gov Compare Post Acute Care list provided to:: Patient Represenative (must comment) Choice offered to / list presented to : Adult Children      Discharge Placement              Patient chooses bed at: [facility] Patient to be transferred to facility by: [facility] Name of family member notified: Daughter [name] Patient and family notified of of transfer: 04/21/23  Discharge Plan and Services Additional resources added to the After Visit Summary for   In-house Referral: Clinical Social Work Discharge Planning Services: CM Consult                                 Social Drivers of Health (SDOH) Interventions SDOH Screenings   Food Insecurity: No Food Insecurity (04/18/2023)  Housing: Low Risk  (04/18/2023)  Transportation Needs: No Transportation Needs (04/18/2023)  Utilities: Not At Risk (04/18/2023)  Depression (PHQ2-9): High Risk (01/05/2023)  Social Connections: Unknown (04/18/2023)  Tobacco Use: Medium Risk (04/17/2023)     Readmission Risk Interventions      No data to display

## 2023-04-21 NOTE — Plan of Care (Signed)

## 2023-05-24 ENCOUNTER — Encounter: Payer: Self-pay | Admitting: Nurse Practitioner

## 2023-05-24 ENCOUNTER — Telehealth: Payer: Self-pay

## 2023-05-24 ENCOUNTER — Ambulatory Visit: Admitting: Nurse Practitioner

## 2023-05-24 ENCOUNTER — Telehealth: Payer: Self-pay | Admitting: *Deleted

## 2023-05-24 ENCOUNTER — Ambulatory Visit (INDEPENDENT_AMBULATORY_CARE_PROVIDER_SITE_OTHER): Admitting: Nurse Practitioner

## 2023-05-24 VITALS — BP 157/91 | HR 62 | Temp 97.8°F | Ht 72.0 in | Wt 175.0 lb

## 2023-05-24 DIAGNOSIS — E782 Mixed hyperlipidemia: Secondary | ICD-10-CM

## 2023-05-24 DIAGNOSIS — F02C0 Dementia in other diseases classified elsewhere, severe, without behavioral disturbance, psychotic disturbance, mood disturbance, and anxiety: Secondary | ICD-10-CM

## 2023-05-24 DIAGNOSIS — H9193 Unspecified hearing loss, bilateral: Secondary | ICD-10-CM | POA: Insufficient documentation

## 2023-05-24 DIAGNOSIS — I2510 Atherosclerotic heart disease of native coronary artery without angina pectoris: Secondary | ICD-10-CM

## 2023-05-24 DIAGNOSIS — G301 Alzheimer's disease with late onset: Secondary | ICD-10-CM | POA: Diagnosis not present

## 2023-05-24 DIAGNOSIS — N320 Bladder-neck obstruction: Secondary | ICD-10-CM

## 2023-05-24 DIAGNOSIS — I10 Essential (primary) hypertension: Secondary | ICD-10-CM

## 2023-05-24 MED ORDER — CHLORTHALIDONE 15 MG PO TABS: 15.0000 mg | ORAL_TABLET | Freq: Every day | ORAL | 1 refills | Status: DC

## 2023-05-24 MED ORDER — TAMSULOSIN HCL 0.4 MG PO CAPS: 0.4000 mg | ORAL_CAPSULE | Freq: Every day | ORAL | 1 refills | Status: DC

## 2023-05-24 MED ORDER — QUETIAPINE FUMARATE 25 MG PO TABS: 25.0000 mg | ORAL_TABLET | Freq: Every day | ORAL | 1 refills | Status: DC

## 2023-05-24 MED ORDER — AMLODIPINE BESYLATE 5 MG PO TABS: 5.0000 mg | ORAL_TABLET | Freq: Every day | ORAL | 1 refills | Status: DC

## 2023-05-24 MED ORDER — ASPIRIN 81 MG PO TBEC: 81.0000 mg | DELAYED_RELEASE_TABLET | Freq: Every day | ORAL | 1 refills | Status: DC

## 2023-05-24 MED ORDER — ATORVASTATIN CALCIUM 40 MG PO TABS: 40.0000 mg | ORAL_TABLET | Freq: Every day | ORAL | 1 refills | Status: DC

## 2023-05-24 NOTE — Telephone Encounter (Signed)
 Fax from [facility] RE: Thalitone 15 mg Note from pharmacy This is Brand only Consider generic 25 mg or 50 mg? Half tablet Please advise

## 2023-05-24 NOTE — Telephone Encounter (Signed)
 Copied from CRM [id number] [id number]. Topic: Clinical - Medication Question >> May 24, 2023 12:43 PM DeAngela L wrote: Reason for CRM: [facility] pham calling about name brand medication Thalitone 15mg  the price is $209 that strength only comes in name brand But the generic brand comes comes 25 mg or 50 mg strength only Would the office like to change the medication to something else ?  [facility] [address] [place] - [address] [place] [address] Phone: [phone] Fax: [phone]

## 2023-05-24 NOTE — Progress Notes (Deleted)
   Established Patient Office Visit  Subjective  Patient ID: Frank West, male    DOB: Feb 20, 1936  Age: 88 y.o. MRN: [id number]  No chief complaint on file.   HPI  {History (Optional):23778}  ROS Negative unless indicated in HPI   Objective:     There were no vitals taken for this visit. {Vitals History (Optional):23777}  Physical Exam   No results found for any visits on 05/24/23.  {Labs (Optional):23779}    Assessment & Plan:  There are no diagnoses linked to this encounter.  No follow-ups on file.    @Francee Setzer  Janee Morn, [place]    Note: This document was prepared by Reubin Milan voice dictation technology and any errors that results from this process are unintentional.

## 2023-05-24 NOTE — Progress Notes (Signed)
 Established Patient Office Visit  Subjective  Patient ID: Frank West, male    DOB: 07-14-1935  Age: 88 y.o. MRN: [id number]  Chief Complaint  Patient presents with   Med Review   SNF discharge follow up    HPI Frank West is an 88 year old male present with his daughter Frank West, Frank West for chronic disease management and ne concerns of hearing loss Frank West was hospitalized on February 16, Frank West for generalized weakness from the hospital he was sent to a long-term care facility [facility] was spent 2 months and was discharged yesterday Frank 24, Frank West and now is at home with his daughter. Per his daughter he was sent home yesterday from the long-term care facility without any medication and was told that he will have to make an appointment with his PCP to determine if she wants to him to continue taking any medications.  PMH coronary artery disease status post PCI and stent and Alzheimer's dementia, BPH.  Frank West was discharged from the hospital on Seroquel West mg, Flomax 0.4 mg daily aspirin 81 mg daily and  Lipitor that he was taking 40 mg tablet daily.  HTN new problem: A 88 year old male with a history of hypertension presents today with a blood pressure reading of 151/97. He reports that he is currently not taking any medications for his hypertension. He denies any dizziness, chest pain, shortness of breath, or other symptoms at this time. No history of recent changes in lifestyle or diet.  Hearing Loss New Initial Encounter An 88 year old patient presents with progressive hearing loss over the past two years. She states, "I have been reading people for a while now," indicating reliance on lip-reading. She reports that her right ear is worse than her left. No associated ear pain, drainage, dizziness, or recent infections. No prior evaluation or use of hearing aids. Denies sudden hearing loss or recent noise exposure. Daughter Frank West requesting a referral to ENT.  patient with a history of  benign prostatic hyperplasia (BPH), Alzheimer's disease with dementia, and behavioral symptoms presents for follow-up. He is currently taking Seroquel (quetiapine) West mg at bedtime for behavioral symptoms and Flomax (tamsulosin) 0.4 mg for BPH. The patient reports no new side effects from his current medication regimen. He has not experienced any changes in his cognitive function or urinary symptoms recently  Lipid/Cholesterol, Follow-up  Last lipid panel Other pertinent labs  Lab Results  Component Value Date   CHOL 175 01/05/2023   HDL 41 01/05/2023   LDLCALC 117 (H) 01/05/2023   TRIG 93 01/05/2023   CHOLHDL 4.3 01/05/2023   Lab Results  Component Value Date   ALT 40 02/16/Frank West   AST 37 02/16/Frank West   PLT 152 02/18/Frank West   TSH 1.690 01/05/2023     He was last seen for this 4 months ago.  Management since that visit includes Lipitor 40 mg. He reports excellent compliance with treatment. He is not having side effects.  Symptoms: No chest pain No chest pressure/discomfort  No dyspnea No lower extremity edema  No numbness or tingling of extremity No orthopnea  No palpitations No paroxysmal nocturnal dyspnea  No speech difficulty No syncope   Current diet: in general, a "healthy" diet   Current exercise: none  The ASCVD Risk score (Arnett DK, et al., 2019) failed to calculate for the following reasons:   The 2019 ASCVD risk score is only valid for ages 88 to 60   Patient Active Problem List   Diagnosis Date Noted  Hearing difficulty of both ears 03/West/Frank West   Sepsis due to gram-negative UTI (HCC) 02/17/Frank West   Dyslipidemia 02/17/Frank West   Acute metabolic encephalopathy 02/16/Frank West   Routine medical exam 01/05/2023   Encounter for screening prostate specific antigen (PSA) measurement 01/05/2023   Urinary incontinence without sensory awareness 01/05/2023   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   Severe late onset Alzheimer's dementia, unspecified whether behavioral, psychotic, or  mood disturbance or anxiety (HCC) 10/26/2022   Chronic constipation 09/30/2022   Presence of right artificial hip joint 09/27/2022   Thrombocytopenia (HCC) 09/26/2022   Transaminitis 09/26/2022   Coronary artery disease without angina pectoris 09/26/2022   Closed subcapital fracture of neck of right femur (HCC) 07/West/2024   Alzheimer's dementia (HCC) 07/West/2024   Fall at home, initial encounter 07/West/2024   Cocaine use 03/11/2017   Benign essential hypertension 03/08/2017   GAD (generalized anxiety disorder) 02/16/2016   Mixed hyperlipidemia 02/16/2016   BPH (benign prostatic hyperplasia) 09/17/2015   Muscle weakness (generalized) 03/02/1999   Presence of coronary angioplasty implant and graft 03/02/1999   Past Medical History:  Diagnosis Date   Alzheimer disease (HCC)    CAD (coronary artery disease)    a. s/p prior stent in 2017 but details unavailable   Past Surgical History:  Procedure Laterality Date   CORONARY ANGIOPLASTY WITH STENT PLACEMENT     HIP ARTHROPLASTY Right 09/27/2022   Procedure: POSTERIOR HIP HEMIATHROPLASTY;  Surgeon: Joen Laura, MD;  Location: [facility] OR;  Service: Orthopedics;  Laterality: Right;   IR TRANSCATHETER BX  2007   done in [place]    uroscopy  2011   Social History   Tobacco Use   Smoking status: Former   Smokeless tobacco: Never  Substance Use Topics   Alcohol use: Never   Drug use: Never   Social History   Socioeconomic History   Marital status: Widowed    Spouse name: Not on file   Number of children: Not on file   Years of education: Not on file   Highest education level: Not on file  Occupational History   Not on file  Tobacco Use   Smoking status: Former   Smokeless tobacco: Never  Substance and Sexual Activity   Alcohol use: Never   Drug use: Never   Sexual activity: Not on file  Other Topics Concern   Not on file  Social History Narrative   Not on file   Social Drivers of Health   Financial Resource Strain:  Not on file  Food Insecurity: No Food Insecurity (2/17/Frank West)   Hunger Vital Sign    Worried About Running Out of Food in the Last Year: Never true    Ran Out of Food in the Last Year: Never true  Transportation Needs: No Transportation Needs (2/17/Frank West)   PRAPARE - Administrator, Civil Service (Medical): No    Lack of Transportation (Non-Medical): No  Physical Activity: Not on file  Stress: Not on file  Social Connections: Unknown (2/17/Frank West)   Social Connection and Isolation Panel [NHANES]    Frequency of Communication with Friends and Family: More than three times a week    Frequency of Social Gatherings with Friends and Family: Three times a week    Attends Religious Services: Never    Active Member of Clubs or Organizations: No    Attends Banker Meetings: Never    Marital Status: Not on file  Intimate Partner Violence: Not At Risk (2/17/Frank West)   Humiliation, Afraid, Rape, and Kick questionnaire  Fear of Current or Ex-Partner: No    Emotionally Abused: No    Physically Abused: No    Sexually Abused: No   Family Status  Relation Name Status   Mother  Deceased   Father  Deceased   Brother  Alive  No partnership data on file   Family History  Problem Relation Age of Onset   Melanoma Father    Obesity Brother    No Known Allergies    Review of Systems  Constitutional:  Negative for chills and fever.  HENT:  Positive for hearing loss. Negative for sore throat.        Bilateral  Respiratory:  Negative for cough and shortness of breath.   Cardiovascular:  Negative for chest pain and leg swelling.  Gastrointestinal:  Negative for abdominal pain, diarrhea, nausea and vomiting.  Skin:  Negative for itching and rash.  Neurological:  Negative for dizziness and headaches.   Negative unless indicated in HPI   Objective:     BP (!) 157/91   Pulse 62   Temp 97.8 F (36.6 C)   Ht 6' (1.829 m)   Wt 175 lb (79.4 kg)   SpO2 97%   BMI 23.73  kg/m  BP Readings from Last 3 Encounters:  03/West/West (!) 157/91  02/20/West 133/83  02/06/West 120/74   Wt Readings from Last 3 Encounters:  03/West/West 175 lb (79.4 kg)  02/17/West 174 lb 9.7 oz (79.2 kg)  02/06/West 172 lb 6.4 oz (78.2 kg)      Physical Exam Vitals and nursing note reviewed.  Constitutional:      Appearance: Normal appearance.  HENT:     Head: Normocephalic and atraumatic.     Right Ear: Decreased hearing noted.     Left Ear: Decreased hearing noted.     Nose: Nose normal.     Mouth/Throat:     Mouth: Mucous membranes are moist.  Eyes:     General: No scleral icterus.    Extraocular Movements: Extraocular movements intact.     Conjunctiva/sclera: Conjunctivae normal.     Pupils: Pupils are equal, round, and reactive to light.  Cardiovascular:     Heart sounds: Normal heart sounds.  Pulmonary:     Effort: Pulmonary effort is normal.     Breath sounds: Normal breath sounds.  Musculoskeletal:        General: Normal range of motion.     Right lower leg: No edema.     Left lower leg: No edema.  Skin:    General: Skin is warm and dry.     Findings: No rash.  Neurological:     Mental Status: He is alert and oriented to person, place, and time. Mental status is at baseline.     Gait: Gait is intact.  Psychiatric:        Mood and Affect: Mood normal.        Behavior: Behavior normal.        Thought Content: Thought content normal.        Judgment: Judgment normal.    No results found for any visits on 03/West/West.  Last CBC Lab Results  Component Value Date   WBC 8.3 02/18/Frank West   HGB 13.4 02/18/Frank West   HCT 40.8 02/18/Frank West   MCV 93.8 02/18/Frank West   MCH 30.8 02/18/Frank West   RDW 12.3 02/18/Frank West   PLT 152 02/18/Frank West   Last metabolic panel Lab Results  Component Value Date   GLUCOSE 123 (H) 02/20/Frank West   NA 135  02/20/Frank West   K 3.8 02/20/Frank West   CL 104 02/20/Frank West   CO2 23 02/20/Frank West   BUN 24 (H) 02/20/Frank West   CREATININE 1.24 02/20/Frank West   GFRNONAA 57 (L)  02/20/Frank West   CALCIUM 8.3 (L) 02/20/Frank West   PROT 7.7 02/16/Frank West   ALBUMIN 3.7 02/16/Frank West   LABGLOB 3.0 01/05/2023   BILITOT 1.7 (H) 02/16/Frank West   ALKPHOS 73 02/16/Frank West   AST 37 02/16/Frank West   ALT 40 02/16/Frank West   ANIONGAP 8 02/20/Frank West   Last lipids Lab Results  Component Value Date   CHOL 175 01/05/2023   HDL 41 01/05/2023   LDLCALC 117 (H) 01/05/2023   TRIG 93 01/05/2023   CHOLHDL 4.3 01/05/2023    Last thyroid functions Lab Results  Component Value Date   TSH 1.690 01/05/2023   T4TOTAL 11.7 01/05/2023        Assessment & Plan:  Hearing difficulty of both ears -     Ambulatory referral to ENT  Severe late onset Alzheimer's dementia, unspecified whether behavioral, psychotic, or mood disturbance or anxiety (HCC) -     QUEtiapine Fumarate; Take 1 tablet (West mg total) by mouth at bedtime.  Dispense: 90 tablet; Refill: 1  Mixed hyperlipidemia -     Atorvastatin Calcium; Take 1 tablet (40 mg total) by mouth daily.  Dispense: 90 tablet; Refill: 1  Severe late onset Alzheimer's dementia, unspecified whether behavioral, psychotic, or mood disturbance or anxiety (HCC) -     QUEtiapine Fumarate; Take 1 tablet (West mg total) by mouth at bedtime.  Dispense: 90 tablet; Refill: 1  Bladder-neck obstruction -     Tamsulosin HCl; Take 1 capsule (0.4 mg total) by mouth daily after supper.  Dispense: 90 capsule; Refill: 1  Coronary artery disease involving native coronary artery of native heart without angina pectoris -     Aspirin; Take 1 tablet (81 mg total) by mouth daily.  Dispense: 90 tablet; Refill: 1  Benign essential hypertension -     Chlorthalidone; Take 1 tablet (15 mg total) by mouth daily.  Dispense: 90 tablet; Refill: 1   Frank West is an 88 year old Caucasian male seen today for chronic disease management, no acute distress  Hypertension: Will start him with chlorthalidone 50 mg 1 tablet daily  Hyperlipidemia: Continue Lipitor 40 mg daily refill provided  BPH: Continue  Flomax  0.4 mg daily refill provided  Coronary artery disease: Continue aspirin 81 mg daily refill provided  Frank West dementia and behavioral issue: Continue Seroquel West mg at bedtime, Refill provided  Bilateral hearing loss: Referral to ENT  Encourage healthy lifestyle choices, including diet (rich in fruits, vegetables, and lean proteins, and low in salt and simple carbohydrates) and exercise (at least 30 minutes of moderate physical activity daily).     The above assessment and management plan was discussed with the patient. The patient verbalized understanding of and has agreed to the management plan. Patient is aware to call the clinic if they develop any new symptoms or if symptoms persist or worsen. Patient is aware when to return to the clinic for a follow-up visit. Patient educated on when it is appropriate to go to the emergency department.  Return in about 6 months (around 9/West/Frank West).    Arrie Aran Frank West, Frank West [facility] [address] [place], [place] [address] [phone]    Note: This document was prepared by Reubin Milan voice dictation technology and any errors that results from this process are unintentional.

## 2023-05-24 NOTE — Addendum Note (Signed)
 Addended by: Martina Sinner on: 05/24/2023 04:48 PM   Modules accepted: Orders

## 2023-05-26 ENCOUNTER — Telehealth: Payer: Self-pay

## 2023-05-26 NOTE — Telephone Encounter (Signed)
 Copied from CRM [id number]. Topic: Clinical - Home Health Verbal Orders >> May 26, 2023  4:32 PM Eunice Blase wrote: Caller/Agency: [facility] per Vonzell Schlatter Number: [phone] secure line Service Requested: Occupational Therapy Frequency: 1x 1 week, 2 x for 2 weeks, 1 x or 3 weeks Any new concerns about the patient? No

## 2023-05-30 ENCOUNTER — Ambulatory Visit: Payer: Self-pay

## 2023-05-30 NOTE — Telephone Encounter (Signed)
 Spoke with Marcelino Duster verbal okay given

## 2023-05-30 NOTE — Telephone Encounter (Signed)
 Copied from CRM [id number]. Topic: Clinical - Medication Question >> May 30, 2023  1:51 PM Everette C wrote: Reason for CRM: Darl Pikes with [facility] has called to request clarity for the patient's prescriptions of amLODipine (NORVASC) 5 MG tablet [[id number]]   Reason for Disposition  [1] Caller requesting NON-URGENT health information AND [2] PCP's office is the best resource  Answer Assessment - Initial Assessment Questions 1. REASON FOR CALL or QUESTION: "What is your reason for calling today?" or "How can I best help you?" or "What question do you have that I can help answer?"   Darl Pikes - OT with [facility] called to clarify pt's medication. On [facility]'s records it shows patient should be taking chlorthalidone 15 MG tablet daily. Pt and daughter present as well.  Daughter would like a call back to clarify which medication pt is to be taking currently.  Daughter states currently patient has been taking norvasc 5mg  daily. Pt has not been taking chlorthalidone 15 MG tablet daily and does not have a prescription for this medication.  Pt/daughter would like to know which medication.  Please give daughter a call back at number on chart.  Protocols used: Information Only Call - No Triage-A-AH

## 2023-05-30 NOTE — Telephone Encounter (Signed)
 Spoke with Frank West (per DPR) advised per [facility] "chlorthalidone 15 MG tablet daily was too expensive, it was d/c. He is only suppose to be taking Amlodipine 5 mg daily "

## 2023-06-02 ENCOUNTER — Ambulatory Visit (INDEPENDENT_AMBULATORY_CARE_PROVIDER_SITE_OTHER)

## 2023-06-02 DIAGNOSIS — N401 Enlarged prostate with lower urinary tract symptoms: Secondary | ICD-10-CM

## 2023-06-02 DIAGNOSIS — G9341 Metabolic encephalopathy: Secondary | ICD-10-CM | POA: Diagnosis not present

## 2023-06-02 DIAGNOSIS — A4150 Gram-negative sepsis, unspecified: Secondary | ICD-10-CM

## 2023-06-02 DIAGNOSIS — R338 Other retention of urine: Secondary | ICD-10-CM

## 2023-06-02 DIAGNOSIS — Z9181 History of falling: Secondary | ICD-10-CM

## 2023-06-02 DIAGNOSIS — G301 Alzheimer's disease with late onset: Secondary | ICD-10-CM

## 2023-06-02 DIAGNOSIS — I10 Essential (primary) hypertension: Secondary | ICD-10-CM

## 2023-06-02 DIAGNOSIS — I2510 Atherosclerotic heart disease of native coronary artery without angina pectoris: Secondary | ICD-10-CM

## 2023-06-02 DIAGNOSIS — N390 Urinary tract infection, site not specified: Secondary | ICD-10-CM

## 2023-06-02 DIAGNOSIS — F0284 Dementia in other diseases classified elsewhere, unspecified severity, with anxiety: Secondary | ICD-10-CM

## 2023-06-02 DIAGNOSIS — Z7982 Long term (current) use of aspirin: Secondary | ICD-10-CM

## 2023-06-02 DIAGNOSIS — F411 Generalized anxiety disorder: Secondary | ICD-10-CM

## 2023-06-03 ENCOUNTER — Encounter (INDEPENDENT_AMBULATORY_CARE_PROVIDER_SITE_OTHER): Payer: Self-pay | Admitting: Otolaryngology

## 2023-06-06 DIAGNOSIS — R42 Dizziness and giddiness: Secondary | ICD-10-CM | POA: Diagnosis not present

## 2023-06-07 DIAGNOSIS — G301 Alzheimer's disease with late onset: Secondary | ICD-10-CM

## 2023-06-10 ENCOUNTER — Encounter (INDEPENDENT_AMBULATORY_CARE_PROVIDER_SITE_OTHER): Payer: Self-pay | Admitting: Otolaryngology

## 2023-06-15 ENCOUNTER — Telehealth: Payer: Self-pay

## 2023-06-15 NOTE — Telephone Encounter (Signed)
 Copied from CRM [id number]. Topic: General - Other >> Jun 15, 2023  8:13 AM Georgeann Kindred wrote: Reason for CRM: Frank West from [facility] called and reported that the patient was discharged from Occupational Therapy and has achieved all of his home health OT goals as of 04/15.

## 2023-07-05 ENCOUNTER — Ambulatory Visit: Payer: Medicare Other | Admitting: Nurse Practitioner

## 2023-07-06 ENCOUNTER — Ambulatory Visit: Payer: Medicare Other | Attending: Student | Admitting: Physician Assistant

## 2023-07-06 ENCOUNTER — Encounter: Payer: Self-pay | Admitting: Physician Assistant

## 2023-07-06 VITALS — BP 124/70 | HR 64 | Ht 72.0 in | Wt 176.0 lb

## 2023-07-06 DIAGNOSIS — R42 Dizziness and giddiness: Secondary | ICD-10-CM | POA: Diagnosis not present

## 2023-07-06 DIAGNOSIS — I493 Ventricular premature depolarization: Secondary | ICD-10-CM | POA: Diagnosis not present

## 2023-07-06 DIAGNOSIS — R600 Localized edema: Secondary | ICD-10-CM | POA: Insufficient documentation

## 2023-07-06 DIAGNOSIS — E782 Mixed hyperlipidemia: Secondary | ICD-10-CM | POA: Diagnosis not present

## 2023-07-06 DIAGNOSIS — I2510 Atherosclerotic heart disease of native coronary artery without angina pectoris: Secondary | ICD-10-CM | POA: Diagnosis present

## 2023-07-06 DIAGNOSIS — I453 Trifascicular block: Secondary | ICD-10-CM | POA: Insufficient documentation

## 2023-07-06 DIAGNOSIS — I10 Essential (primary) hypertension: Secondary | ICD-10-CM | POA: Insufficient documentation

## 2023-07-06 DIAGNOSIS — E785 Hyperlipidemia, unspecified: Secondary | ICD-10-CM | POA: Diagnosis present

## 2023-07-06 NOTE — Progress Notes (Signed)
 Cardiology Office Note:  .   Date:  07/06/2023  ID:  Frank West, DOB [birth year], MRN [id number] PCP: Frank Baton, NP  Frank West Providers Cardiologist:  Frank Lander, MD { History of Present Illness: .   Frank West is a 88 y.o. male with hx of CAD (s/p Stent 2017 in [place]  per chart review, no documentation), HTN, HLD, Dementia, hx of PVC's (Monitor 04/2023:12% PVC burden), hx of  trifascicular block, Hip fracture s/p surgery 08/2022 presents to OV for 3 m/o f/u and monitor review.   Last seen in [place]  04/07/2023 with Dr. Amanda West. At that time, he was worked up for dizziness. Continued on ASA. Started on Lipitor 40 mg. Follow up 7 day zio monitor which showed predominant rhythm as sinus rhythm with avg HR of 72, 12 %  PVC burdren, 7 runs of Vtach with longest lasting 6 beats, 1 episode of High grade AV block, idioventricular rhythm, and Wenckeback.   Recently hospitalized in 04/2023 for acute metabolic encephalopathy in the setting of sepsis and  UTI. Completed ABX while admitted. Started on seroquel  for mood and behavior disturbances. Continued on daily ASA 81 mg, Lipitor 40 mg, Tamsulosin  0.4 mg. Also seen by family medicine on 05/24/2023 with Frank West who started Chlorthalidone  50 mg daily for HTN with BP 157/91. Follow up telephone note 3/31/202 reported Chorthalidone was too expensive therefore it was d/c and started Amlopdipine.   On interview, patient is accompanied by his daughter who provide care and manges medications. History is obtained from daughter due to patient history of dementia. Reported ongoing dizziness when standing. Also noted SOB with minimal exertion sometimes x 1 year. Also noted LE edema x 1-2 months that is relieved with leg elevation. Denies any CP or syncope. Poor hydration status since refuses to drink H2O. Only drink 4 glasses of cherry soda and 1 cup of coffee daily. Endorse a healthy diet. Able to get activity with walking the  dog when not hindered by SOB.  Studies Reviewed: .    08/2022 echo 1. Left ventricular ejection fraction, by estimation, is 55 to 60%. The  left ventricle has normal function. The left ventricle has no regional  wall motion abnormalities. There is moderate left ventricular hypertrophy.  Left ventricular diastolic  parameters are consistent with Grade I diastolic dysfunction (impaired  relaxation).   2. Right ventricular systolic function is normal. The right ventricular  size is normal.   3. The mitral valve is normal in structure. No evidence of mitral valve  regurgitation. No evidence of mitral stenosis.   4. The aortic valve has an indeterminant number of cusps. There is mild  calcification of the aortic valve. There is mild thickening of the aortic  valve. Aortic valve regurgitation is not visualized. No aortic stenosis is  present.   5. The inferior vena cava is normal in size with greater than 50%  respiratory variability, suggesting right atrial pressure of 3 mmHg.   04/2023 Monitor    7 day monitor   Rare supraventricular ectopy in the form of isolated PACs.   Frequent ventricular ectopy in the form of isolated PVCs (12.3% burden). Rare couplets, triplets. 7 runs of NSVT longest 6 beats.   No symptoms reported   First degree AV block noted. Transient isolated 3:1 AV block episode at 1120pm suspect while sleeping. Wenchebach block also noted in early AM hours. Patch Wear Time:  7 days and 4 hours (2025-02-06T13:29:36-0500 to 2025-02-13T18:04:48-0500)  Patient had a min HR of 21 bpm, max HR of 162 bpm, and avg HR of 72 bpm. Predominant underlying rhythm was Sinus Rhythm. First Degree AV Block was present. 7 Ventricular Tachycardia runs occurred, the run with the fastest interval lasting 4 beats with a  max rate of 162 bpm, the longest lasting 6 beats with an avg rate of 119 bpm. 1 episode(s) of AV Block (High Grade) occurred, lasting a total of 3 secs. Idioventricular Rhythm was  present. Second Degree AV Block-Mobitz I (Wenckebach) was present.  Isolated SVEs were rare (<1.0%), and no SVE Couplets or SVE Triplets were present. Isolated VEs were frequent (12.3%, 91748), VE Couplets were rare (<1.0%, 986), and VE Triplets were rare (<1.0%, 115). Ventricular Bigeminy and Trigeminy were present.     Physical Exam:   VS:  BP 124/70 (BP Location: Right Arm, Cuff Size: Normal)   Pulse 64   Ht 6' (1.829 m)   Wt 176 lb (79.8 kg)   BMI 23.87 kg/m    Wt Readings from Last 3 Encounters:  07/06/23 176 lb (79.8 kg)  05/24/23 175 lb (79.4 kg)  04/18/23 174 lb 9.7 oz (79.2 kg)    GEN: Sitting in chair in no acute distress with daughter present NECK: No JVD; No carotid bruits CARDIAC: RRR, no murmurs, rubs, gallops RESPIRATORY:  Clear to auscultation without rales, wheezing or rhonchi  ABDOMEN: Soft, non-tender, non-distended EXTREMITIES:  No edema; No deformity   ASSESSMENT AND PLAN: .   Dizziness  Trifascicular block  PVC's  Per Dr. Amanda West 04/2023, EKG with chronic trifascicular block  Reviewed Monitor 04/2023: predominant rhythm as sinus rhythm with avg HR of 72, 12 % PVC burdren, 7 runs of Vtach with longest lasting 6 beats, 1 episode of High grade AV block, idioventricular rhythm, and Wenckeback. Reported ongoing dizziness when standing up. Could be 2/2 to dehydration and/or orthostatics, however, concern for cardiac cause with monitor report. Encourage water hydration.  Would avoid BB in setting of trifascicular block. Referred to EP for PVC's and Trifascicular block.   HTN  BP this OV: 124/70 Continue Amlodipine  5 mg.   Hx of LE Edema  Started Amlodipine  5 mg 05/30/2023. Reported LE edema x 1-2 months when standing for prolonged periods that is relieved with leg elevation. No edema noted on exam today. Discussed amlodipine  can be underlying cause. If worsens, can consider reducing to 2.5 mg or d/c in the future  Given rx for Ted hose.   CAD HLD  s/p Stent 2017  in [place]  per chart review, no documentations ECHO 08/2022: EF 55-60%, moderate LVH, G1DD, mild AV calcification without AS.  12/2022 LDL 117. 04/17/2023 AST/ALT WNL  Ordered CMP and FLP. Started Lipitor 04/17/2023. Continue ASA 81 mg, Lipitor 40 mg  unless abnormal labs.     Dispo: ordered CMP/FLP. Referred to EP for PVC and trifascicular block. TED hose. Encourage water hydration. F/U in 3-4 months.   Signed, Metta Actis, PA-C

## 2023-07-06 NOTE — Patient Instructions (Signed)
 Medication Instructions:  Your physician recommends that you continue on your current medications as directed. Please refer to the Current Medication list given to you today.  *If you need a refill on your cardiac medications before your next appointment, please call your pharmacy*  Lab Work: Your physician recommends that you return for lab work ( FASTING)   If you have labs (blood work) drawn today and your tests are completely normal, you will receive your results only by: MyChart Message (if you have MyChart) OR A paper copy in the mail If you have any lab test that is abnormal or we need to change your treatment, we will call you to review the results.  Testing/Procedures: NONE   Follow-Up: At [facility], you and your health needs are our priority.  As part of our continuing mission to provide you with exceptional heart care, our providers are all part of one team.  This team includes your primary Cardiologist (physician) and Advanced Practice Providers or APPs (Physician Assistants and Nurse Practitioners) who all work together to provide you with the care you need, when you need it.  Your next appointment:   3 month(s)  Provider:   You may see Armida Lander, MD or one of the following Advanced Practice Providers on your designated Care Team:   Woodfin Hays, PA-C  Scotesia Toad Hop, [place] Theotis Flake, [place]     We recommend signing up for the patient portal called "MyChart".  Sign up information is provided on this After Visit Summary.  MyChart is used to connect with patients for Virtual Visits (Telemedicine).  Patients are able to view lab/test results, encounter notes, upcoming appointments, etc.  Non-urgent messages can be sent to your provider as well.   To learn more about what you can do with MyChart, go to [email or web address].   Other Instructions Thank you for choosing [facility]!

## 2023-07-28 ENCOUNTER — Ambulatory Visit: Attending: Internal Medicine | Admitting: Internal Medicine

## 2023-07-29 ENCOUNTER — Encounter: Payer: Self-pay | Admitting: Internal Medicine

## 2023-08-01 ENCOUNTER — Ambulatory Visit (INDEPENDENT_AMBULATORY_CARE_PROVIDER_SITE_OTHER): Admitting: Physician Assistant

## 2023-08-01 ENCOUNTER — Encounter (INDEPENDENT_AMBULATORY_CARE_PROVIDER_SITE_OTHER): Payer: Self-pay | Admitting: Physician Assistant

## 2023-08-01 ENCOUNTER — Ambulatory Visit (INDEPENDENT_AMBULATORY_CARE_PROVIDER_SITE_OTHER): Admitting: Audiology

## 2023-08-01 VITALS — BP 163/96 | HR 62 | Ht 72.0 in | Wt 175.0 lb

## 2023-08-01 DIAGNOSIS — H6120 Impacted cerumen, unspecified ear: Secondary | ICD-10-CM

## 2023-08-01 DIAGNOSIS — H9193 Unspecified hearing loss, bilateral: Secondary | ICD-10-CM

## 2023-08-01 DIAGNOSIS — H6123 Impacted cerumen, bilateral: Secondary | ICD-10-CM

## 2023-08-01 NOTE — Progress Notes (Signed)
 Dear Dr. Villa Greaser, Here is my assessment for our mutual patient, Frank West. Thank you for allowing me the opportunity to care for your patient. Please do not hesitate to contact me should you have any other questions. Sincerely, Belma Boxer PA-C  Otolaryngology Clinic Note Referring provider: Dr. Villa Greaser HPI:  Frank West is a 88 y.o. male kindly referred by Dr. Villa Greaser   The patient is an 88 year old gentleman seen in our office for evaluation of decreased hearing.  He is accompanied by his daughter and granddaughter.  They note that he has had a longstanding history of hearing loss.  They note over the last year his hearing has gotten worse.  He feels like both ears are equal.  He denies any head or neck surgery, no significant dizziness, no loud noise exposure, no history of recurrent ear infections.  He denies any ringing.  He had an appoint with audiology today but had bilateral cerumen impaction was unable to complete it.      Independent Review of Additional Tests or Records:  none   PMH/Meds/All/SocHx/FamHx/ROS:   Past Medical History:  Diagnosis Date   Alzheimer disease (HCC)    CAD (coronary artery disease)    a. s/p prior stent in 2017 but details unavailable     Past Surgical History:  Procedure Laterality Date   CORONARY ANGIOPLASTY WITH STENT PLACEMENT     HIP ARTHROPLASTY Right 09/27/2022   Procedure: POSTERIOR HIP HEMIATHROPLASTY;  Surgeon: Murleen Arms, MD;  Location: [facility] OR;  Service: Orthopedics;  Laterality: Right;   IR TRANSCATHETER BX  2007   done in [place]     uroscopy  2011    Family History  Problem Relation Age of Onset   Melanoma Father    Obesity Brother      Social Connections: Unknown (04/18/2023)   Social Connection and Isolation Panel [NHANES]    Frequency of Communication with Friends and Family: More than three times a week    Frequency of Social Gatherings with Friends and Family: Three times a  week    Attends Religious Services: Never    Active Member of Clubs or Organizations: No    Attends Banker Meetings: Never    Marital Status: Not on file      Current Outpatient Medications:    amLODipine  (NORVASC ) 5 MG tablet, Take 1 tablet (5 mg total) by mouth daily., Disp: 90 tablet, Rfl: 1   aspirin  EC (ASPIRIN  81) 81 MG tablet, Take 1 tablet (81 mg total) by mouth daily., Disp: 90 tablet, Rfl: 1   atorvastatin  (LIPITOR) 40 MG tablet, Take 1 tablet (40 mg total) by mouth daily., Disp: 90 tablet, Rfl: 1   QUEtiapine  (SEROQUEL ) 25 MG tablet, Take 1 tablet (25 mg total) by mouth at bedtime., Disp: 90 tablet, Rfl: 1   tamsulosin  (FLOMAX ) 0.4 MG CAPS capsule, Take 1 capsule (0.4 mg total) by mouth daily after supper., Disp: 90 capsule, Rfl: 1   Physical Exam:   BP (!) 163/96   Pulse 62   Ht 6' (1.829 m)   Wt 175 lb (79.4 kg)   SpO2 96%   BMI 23.73 kg/m   Pertinent Findings  CN II-XII intact Bilateral external auditory canals with cerumen impaction Anterior rhinoscopy: Septum midline No lesions of oral cavity/oropharynx; dentition within normal limits No obviously palpable neck masses/lymphadenopathy/thyromegaly No respiratory distress or stridor  Seprately Identifiable Procedures:  None  Impression & Plans:  Frank West is a 88  y.o. male with the following   Cerumen impaction-  Cerumen impaction here today.  I was unable to remove the impaction, the patient is unable to lay his head back, he was unable to sit still for disimpaction.  The cerumen is very hard, I recommended Debrox for the next 2 weeks.  At that time I will see him in the office for cerumen removal.  He will also have audiological evaluation at that time.  Hearing loss-  Part of his hearing loss is likely secondary to the cerumen impaction, he will still need audiological evaluation after cerumen removal.   - f/u 2 weeks with audiological evaluation and office visit   Thank you for  allowing me the opportunity to care for your patient. Please do not hesitate to contact me should you have any other questions.  Sincerely, Belma Boxer PA-C [facility] Phone: [phone] Fax: [phone]  08/01/2023, 11:14 AM

## 2023-08-01 NOTE — Patient Instructions (Signed)
 Please place 5 to 10 drops of carbamide peroxide (Debrox) into the affected ear. Keep the drops in your ear for several minutes by keeping your head tilted or placing a cotton ball in your ear. If needed, continue to use carbamide peroxide (Debrox) twice daily for up to 4 days.

## 2023-08-01 NOTE — Progress Notes (Unsigned)
  [address] [place], [place] [address] [phone]  Audiological Evaluation    Name: Frank West     DOB:   04/04/1935      MRN:   [id number]                                                                                     Service Date: 08/01/2023     Accompanied by: daughter and granddaughter    Patient comes today after Lorane Rocker, PA-C sent a referral for a hearing evaluation due to concerns with hearing loss.   Symptoms Yes Details  Hearing loss  []    Tinnitus  []    Ear pain/ infections/pressure  []    Balance problems  []    Noise exposure history  []    Previous ear surgeries  []    Family history of hearing loss  []    Amplification  []    Other  []      Otoscopy: Right ear: {otoscopy:31227} Left ear:  {otoscopy:31227}  Tympanometry: Right ear: {tympanometry results:31367}. Left ear: {tympanometry results:31367}.    Pure tone Audiometry: Right ear- *** {hearing loss types:31372::"sensorineural hearing loss"} from 125 Hz - 8000 Hz. Left ear-  *** {hearing loss types:31372::"sensorineural hearing loss"} from 125 Hz - 8000 Hz.  Speech Audiometry: Right ear- Speech Reception Threshold (SRT) was obtained at *** dBHL. Left ear-Speech Reception Threshold (SRT) was obtained at *** dBHL.   Word Recognition Score Tested using NU-6 (MLV) Right ear: ***% was obtained at a presentation level of *** dBHL with contralateral masking which is deemed as  {word recognition score:31373}. Left ear: ***% was obtained at a presentation level of *** dBHL with contralateral masking which is deemed as  {word recognition score:31373}.   The hearing test results were completed under headphones and results are deemed to be of good to fair reliability. Test technique:  conventional     Recommendations: Follow up with ENT as scheduled for today. Return for a hearing evaluation if concerns with hearing changes arise or per MD recommendation. Consider a communication  needs assessment after medical clearance for hearing aids is obtained.   Arista Kettlewell MARIE LEROUX-MARTINEZ, AUD

## 2023-08-04 ENCOUNTER — Ambulatory Visit: Payer: Self-pay | Admitting: Cardiology

## 2023-08-08 ENCOUNTER — Ambulatory Visit: Attending: Internal Medicine | Admitting: Internal Medicine

## 2023-08-22 ENCOUNTER — Ambulatory Visit (INDEPENDENT_AMBULATORY_CARE_PROVIDER_SITE_OTHER): Admitting: Physician Assistant

## 2023-08-22 ENCOUNTER — Ambulatory Visit (INDEPENDENT_AMBULATORY_CARE_PROVIDER_SITE_OTHER): Admitting: Audiology

## 2023-08-22 NOTE — Progress Notes (Deleted)
  [address] [place], [place] [address] [phone]  Audiological Evaluation    Name: Frank West     DOB:   10-21-1935      MRN:   [id number]                                                                                     Service Date: 08/22/2023     Accompanied by: ***   Patient comes today after {ENT providers:31223} sent a referral for a hearing evaluation due to concerns with {audiology symptoms:31224}.   Symptoms Yes Details  Hearing loss  []    Tinnitus  []    Ear pain/ infections/pressure  []    Balance problems  []    Noise exposure history  []    Previous ear surgeries  []    Family history of hearing loss  []    Amplification  []    Other  []      Otoscopy: Right ear: {otoscopy:31227} Left ear:  {otoscopy:31227}  Tympanometry: Right ear: {tympanometry results:31367}. Left ear: {tympanometry results:31367}.   Pure tone Audiometry: Right ear- *** {hearing loss types:31372::sensorineural hearing loss} from *** Hz - *** Hz. Left ear-  *** {hearing loss types:31372::sensorineural hearing loss} from *** Hz - *** Hz.  Speech Audiometry: Right ear- Speech Reception Threshold (SRT) was obtained at *** dBHL. Left ear-Speech Reception Threshold (SRT) was obtained at *** dBHL.   Word Recognition Score Tested using NU-6 (recorded) Right ear: ***% was obtained at a presentation level of *** dBHL with contralateral masking which is deemed as  {word recognition score:31373}. Left ear: ***% was obtained at a presentation level of *** dBHL with contralateral masking which is deemed as  {word recognition score:31373}.   The hearing test results were completed {transducer options:31388} and results are deemed to be of {test reliability:31390::good reliability}. Test technique:  {audiometric test technique:31400::conventional}    Impression: {Word recognition Score interpretation:31432::There is not a significant difference in pure-tone thresholds  between ears.,There is not a significant difference in the word recognition score in between ears. }   Recommendations: {Audiology Recommendations:31370::Follow up with ENT as scheduled for today.}   Jaymason Ledesma MARIE LEROUX-MARTINEZ, AUD

## 2023-08-23 ENCOUNTER — Ambulatory Visit (INDEPENDENT_AMBULATORY_CARE_PROVIDER_SITE_OTHER): Admitting: Audiology

## 2023-08-23 ENCOUNTER — Ambulatory Visit (INDEPENDENT_AMBULATORY_CARE_PROVIDER_SITE_OTHER): Admitting: Physician Assistant

## 2023-09-07 ENCOUNTER — Ambulatory Visit: Admitting: Neurology

## 2023-10-07 ENCOUNTER — Ambulatory Visit: Admitting: Student

## 2023-10-07 NOTE — Progress Notes (Deleted)
 Cardiology Office Note    Date:  10/07/2023  ID:  Klein, Willcox August 09, 1935, MRN [id number] Cardiologist: Alvan Carrier, MD { :  History of Present Illness:    Frank West is a 88 y.o. male with past medical history of CAD (prior stenting in 2017 but details unavailable), PVC's (monitor in 04/2023 showed a 12.3% burden) and dementia who presents to the office today for 22-month follow-up.  He was last examined by Dena Kapur, PA in 06/2023 and had recently been hospitalized for acute metabolic encephalopathy in the setting of sepsis and a UTI. At the time of follow-up, most history was provided by his daughter due to his dementia. He reported ongoing dizziness with standing and worsening lower extremity edema over the past 1 to 2 months. Refused to drink water and was mostly consuming soda and coffee. Given that recent monitor had shown a 12% PVC burden and his EKG showed a chronic trifascicular block, he was not started on beta-blocker therapy and was referred to EP for further management of his PVC's. He had an appointment with Dr. Waddell on 08/08/2023 but no showed for this.  - FLP, LFT's  ROS: ***  Studies Reviewed:   EKG: EKG is*** ordered today and demonstrates ***   EKG Interpretation Date/Time:    Ventricular Rate:    PR Interval:    QRS Duration:    QT Interval:    QTC Calculation:   R Axis:      Text Interpretation:         Echocardiogram: 08/2022 IMPRESSIONS     1. Left ventricular ejection fraction, by estimation, is 55 to 60%. The  left ventricle has normal function. The left ventricle has no regional  wall motion abnormalities. There is moderate left ventricular hypertrophy.  Left ventricular diastolic  parameters are consistent with Grade I diastolic dysfunction (impaired  relaxation).   2. Right ventricular systolic function is normal. The right ventricular  size is normal.   3. The mitral valve is normal in structure. No evidence of mitral  valve  regurgitation. No evidence of mitral stenosis.   4. The aortic valve has an indeterminant number of cusps. There is mild  calcification of the aortic valve. There is mild thickening of the aortic  valve. Aortic valve regurgitation is not visualized. No aortic stenosis is  present.   5. The inferior vena cava is normal in size with greater than 50%  respiratory variability, suggesting right atrial pressure of 3 mmHg.   Event Monitor: 04/2023   7 day monitor   Rare supraventricular ectopy in the form of isolated PACs.   Frequent ventricular ectopy in the form of isolated PVCs (12.3% burden). Rare couplets, triplets. 7 runs of NSVT longest 6 beats.   No symptoms reported   First degree AV block noted. Transient isolated 3:1 AV block episode at 1120pm suspect while sleeping. Wenchebach block also noted in early AM hours.     Patch Wear Time:  7 days and 4 hours (2025-02-06T13:29:36-0500 to 2025-02-13T18:04:48-0500)   Patient had a min HR of 21 bpm, max HR of 162 bpm, and avg HR of 72 bpm. Predominant underlying rhythm was Sinus Rhythm. First Degree AV Block was present. 7 Ventricular Tachycardia runs occurred, the run with the fastest interval lasting 4 beats with a  max rate of 162 bpm, the longest lasting 6 beats with an avg rate of 119 bpm. 1 episode(s) of AV Block (High Grade) occurred, lasting a total of 3  secs. Idioventricular Rhythm was present. Second Degree AV Block-Mobitz I (Wenckebach) was present.  Isolated SVEs were rare (<1.0%), and no SVE Couplets or SVE Triplets were present. Isolated VEs were frequent (12.3%, 91748), VE Couplets were rare (<1.0%, 986), and VE Triplets were rare (<1.0%, 115). Ventricular Bigeminy and Trigeminy were present.  Risk Assessment/Calculations:   {Does this patient have ATRIAL FIBRILLATION?[phone]} No BP recorded.  {Refresh Note OR Click here to enter BP  :1}***         Physical Exam:   VS:  There were no vitals taken for this visit.    Wt Readings from Last 3 Encounters:  08/01/23 175 lb (79.4 kg)  07/06/23 176 lb (79.8 kg)  05/24/23 175 lb (79.4 kg)     GEN: Well nourished, well developed in no acute distress NECK: No JVD; No carotid bruits CARDIAC: ***RRR, no murmurs, rubs, gallops RESPIRATORY:  Clear to auscultation without rales, wheezing or rhonchi  ABDOMEN: Appears non-distended. No obvious abdominal masses. EXTREMITIES: No clubbing or cyanosis. No edema.  Distal pedal pulses are 2+ bilaterally.   Assessment and Plan:   1. PVC's/Trifascicular Block - Prior EKG tracings have shown trifascicular block with first-degree AV block, LAFB and RBBB. Recent monitor showed predominantly normal sinus rhythm but he did have a 12.3% PVC burden and 1 episode of high-grade AV block but only lasting for 3 seconds. ***  2. CAD - He previously underwent stenting in 2017 but details unavailable.  *** - Continue current medical therapy with ASA 81 mg daily and atorvastatin  40 mg daily.  3. HLD - LDL was at 117 when checked in 12/2022 and he was ultimately started on atorvastatin  40 mg daily in 04/2023.  4. HTN - BP is at *** during today's visit. Continue Amlodipine  2.5mg  daily.   Signed, Laymon CHRISTELLA Qua, PA-C

## 2023-10-13 NOTE — Progress Notes (Deleted)
 Cardiology Office Note    Date:  10/13/2023  ID:  Frank West, DOB 1935-08-01, MRN [id number] Cardiologist: Alvan Carrier, MD { :  History of Present Illness:    Frank West is a 88 y.o. male with past medical history of CAD (prior stenting in 2017 but details unavailable), PVC's (monitor in 04/2023 showed a 12.3% burden) and dementia who presents to the office today for 79-month follow-up.  He was last examined by Dena Kapur, PA in 06/2023 and had recently been hospitalized for acute metabolic encephalopathy in the setting of sepsis and a UTI. At the time of follow-up, most history was provided by his daughter due to his dementia. He reported ongoing dizziness with standing and worsening lower extremity edema over the past 1 to 2 months. Refused to drink water and was mostly consuming soda and coffee. Given that recent monitor had shown a 12% PVC burden and his prior EKG showed a chronic trifascicular block, he was not started on beta-blocker therapy and was referred to EP for further management of his PVC's. He had an appointment with Dr. Waddell on 08/08/2023 but no showed for this.  - FLP, LFT's  ROS: ***  Studies Reviewed:   EKG: EKG is*** ordered today and demonstrates ***   EKG Interpretation Date/Time:    Ventricular Rate:    PR Interval:    QRS Duration:    QT Interval:    QTC Calculation:   R Axis:      Text Interpretation:         Echocardiogram: 08/2022 IMPRESSIONS     1. Left ventricular ejection fraction, by estimation, is 55 to 60%. The  left ventricle has normal function. The left ventricle has no regional  wall motion abnormalities. There is moderate left ventricular hypertrophy.  Left ventricular diastolic  parameters are consistent with Grade I diastolic dysfunction (impaired  relaxation).   2. Right ventricular systolic function is normal. The right ventricular  size is normal.   3. The mitral valve is normal in structure. No evidence of  mitral valve  regurgitation. No evidence of mitral stenosis.   4. The aortic valve has an indeterminant number of cusps. There is mild  calcification of the aortic valve. There is mild thickening of the aortic  valve. Aortic valve regurgitation is not visualized. No aortic stenosis is  present.   5. The inferior vena cava is normal in size with greater than 50%  respiratory variability, suggesting right atrial pressure of 3 mmHg.   Event Monitor: 04/2023   7 day monitor   Rare supraventricular ectopy in the form of isolated PACs.   Frequent ventricular ectopy in the form of isolated PVCs (12.3% burden). Rare couplets, triplets. 7 runs of NSVT longest 6 beats.   No symptoms reported   First degree AV block noted. Transient isolated 3:1 AV block episode at 1120pm suspect while sleeping. Wenchebach block also noted in early AM hours.     Patch Wear Time:  7 days and 4 hours (2025-02-06T13:29:36-0500 to 2025-02-13T18:04:48-0500)   Patient had a min HR of 21 bpm, max HR of 162 bpm, and avg HR of 72 bpm. Predominant underlying rhythm was Sinus Rhythm. First Degree AV Block was present. 7 Ventricular Tachycardia runs occurred, the run with the fastest interval lasting 4 beats with a  max rate of 162 bpm, the longest lasting 6 beats with an avg rate of 119 bpm. 1 episode(s) of AV Block (High Grade) occurred, lasting a total of  3 secs. Idioventricular Rhythm was present. Second Degree AV Block-Mobitz I (Wenckebach) was present.  Isolated SVEs were rare (<1.0%), and no SVE Couplets or SVE Triplets were present. Isolated VEs were frequent (12.3%, 91748), VE Couplets were rare (<1.0%, 986), and VE Triplets were rare (<1.0%, 115). Ventricular Bigeminy and Trigeminy were present.  Risk Assessment/Calculations:   {Does this patient have ATRIAL FIBRILLATION?:[phone]} No BP recorded.  {Refresh Note OR Click here to enter BP  :1}***         Physical Exam:   VS:  There were no vitals taken for this  visit.   Wt Readings from Last 3 Encounters:  08/01/23 175 lb (79.4 kg)  07/06/23 176 lb (79.8 kg)  05/24/23 175 lb (79.4 kg)     GEN: Well nourished, well developed in no acute distress NECK: No JVD; No carotid bruits CARDIAC: ***RRR, no murmurs, rubs, gallops RESPIRATORY:  Clear to auscultation without rales, wheezing or rhonchi  ABDOMEN: Appears non-distended. No obvious abdominal masses. EXTREMITIES: No clubbing or cyanosis. No edema.  Distal pedal pulses are 2+ bilaterally.   Assessment and Plan:   1. PVC's/Trifascicular Block - Prior EKG tracings have shown trifascicular block with first-degree AV block, LAFB and RBBB. Recent monitor showed predominantly normal sinus rhythm but he did have a 12.3% PVC burden and 1 episode of high-grade AV block but only lasting for 3 seconds. ***  2. CAD - He previously underwent stenting in 2017 but details unavailable.  *** - Continue current medical therapy with ASA 81 mg daily and Atorvastatin 40 mg daily.  3. HLD - LDL was at 117 when checked in 12/2022 and he was ultimately started on Atorvastatin 40 mg daily in 04/2023.  4. HTN - BP is at *** during today's visit. Continue Amlodipine 2.5mg  daily.   Signed, Laymon CHRISTELLA Qua, PA-C

## 2023-10-14 ENCOUNTER — Ambulatory Visit: Attending: Student | Admitting: Student

## 2023-11-15 NOTE — Progress Notes (Signed)
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 Confusion. Slurred speech. Trouble speaking, or trouble understanding speech, or both (aphasia). Vision changes in one or both eyes. This may be double vision, blurred vision, or loss of vision. Severe headache with no known cause. The headache is often described as the worst headache ever experienced. PAD, which may cause pain, numbness, or nonhealing wounds, often in your legs and hips. Renal failure. This may cause tiredness, problems with urination, swelling, and itchy skin. How is this diagnosed? This condition is diagnosed based on your medical history and a physical exam. During the exam, your health care provider will: Check your pulse in different places. Listen for a whooshing sound over your arteries (bruit). You may also have tests, such as: Blood tests to  check your levels of cholesterol, triglycerides, blood sugar, and CRP. Ankle-brachial index to compare blood pressure in your arms to blood pressure in your ankles to see how your blood is  flowing. Heart (cardiac) tests. Electrocardiogram (ECG) to check for heart damage. Stress test to see how your heart reacts to exercise. Ultrasound tests. Ultrasound of your peripheral arteries to check blood flow. Echocardiogram to get images of your heart's chambers and valves. X-ray tests. Chest X-ray to see if you have an enlarged heart, which is a sign of heart failure. CT scan to check for damage to your heart, brain, or arteries. Angiogram. This is a test where dye is injected and X-rays are used to see the blood flow in the arteries. How is this treated? This condition is treated with lifestyle changes as the first step. These may include: Changing your diet. Losing weight. Reducing stress. Exercising and being physically active more regularly. Quitting smoking. You may also need medicine to: Lower triglycerides and cholesterol. Control blood pressure. Prevent blood clots. Lower inflammation in your body. Control your blood sugar. Sometimes, surgery is needed to: Remove plaque from an artery (endarterectomy). Open or widen a narrowed heart artery or peripheral artery (angioplasty). Create a new path for your blood with one of these procedures: Heart (coronary) artery bypass graft surgery. Peripheral artery bypass graft surgery. Place a small mesh tube (stent) in an artery to open or widen a narrowed artery. Follow these instructions at home: Eating and drinking  Eat a heart-healthy diet. Talk with your health care provider or a dietitian if you need help. A heart-healthy diet involves: Limiting unhealthy fats and increasing healthy fats. Some examples of healthy fats are avocados and olive oil. Eating plant-based foods, such as fruits, vegetables, nuts, whole grains, and legumes (such  as peas and lentils). If you drink alcohol : Limit how much you have to: 0-1 drink a day for women who are not pregnant. 0-2 drinks a day for men. Know how much alcohol  is in a drink. In the [place] one drink equals one 12 oz bottle of beer (355 mL), one 5 oz glass of wine (148 mL), or one 1 oz glass of hard liquor (44 mL). Lifestyle  Maintain a healthy weight. Lose weight if your health care provider says that you need to do that. Follow an exercise program as told by your health care provider. Do not use any products that contain nicotine or tobacco. These products include cigarettes, chewing tobacco, and vaping devices, such as e-cigarettes. If you need help quitting, ask your health care provider. Do not use drugs. General instructions Take over-the-counter and prescription medicines only as told by your health care provider. Manage other health conditions as told. Keep all follow-up visits. This is important. Contact a health care provider if you have: An irregular heartbeat. Unexplained tiredness (fatigue). Trouble urinating, or you are producing less urine or foamy urine. Swelling of your hands or feet, or itchy skin. Unexplained pain or numbness in your legs or hips. A wound that is slow to heal or is not healing. Get help right away if: You have any symptoms of a heart attack. These may be: Chest pain. This includes squeezing chest pain that may feel like indigestion (angina). Shortness of breath. Pain in your neck, jaw, arms, back, or stomach. Cold sweat. Nausea. Light-headedness. Sudden pain, numbness, or coldness in a limb. You have any symptoms of a stroke. BE FAST is an easy way to remember the main warning signs of a stroke: B - Balance. Signs are dizziness, sudden trouble walking, or loss of balance. E - Eyes. Signs are trouble seeing or a sudden change in vision. F - Face. Signs are  sudden weakness or numbness of the face, or the face or eyelid drooping on one  side. A - Arms. Signs are weakness or numbness in an arm. This happens suddenly and usually on one side of the body. S - Speech. Signs are sudden trouble speaking, slurred speech, or trouble understanding what people say. T - Time. Time to call emergency services. Write down what time symptoms started. You have other signs of a stroke, such as: A sudden, severe headache with no known cause. Nausea or vomiting. Seizure. These symptoms may represent a serious problem that is an emergency. Do not wait to see if the symptoms will go away. Get medical help right away. Call your local emergency services (911 in the [place] Do not drive yourself to the hospital. Summary Atherosclerosis is when plaque builds up in the arteries and causes narrowing and hardening of the arteries. Plaque occurs due to inflammation or from a buildup of fat, cholesterol, calcium , cellular waste products, and fibrin. This condition may not cause any symptoms. Symptoms of atherosclerosis do not occur until the plaque severely narrows or blocks the artery. Treatment starts with lifestyle changes and may include medicines. In some cases, surgery is needed. Get help right away if you have any symptoms of a heart attack or stroke. This information is not intended to replace advice given to you by your health care provider. Make sure you discuss any questions you have with your health care provider. Document Revised: 05/21/2020 Document Reviewed: 05/21/2020 Elsevier Patient Education  2024 [employer] Dyslipidemia Dyslipidemia is an imbalance of waxy, fat-like substances (lipids) in the blood. The body needs lipids in small amounts. Dyslipidemia often involves a high level of cholesterol or triglycerides, which are types of lipids. Common forms of dyslipidemia include: High levels of LDL cholesterol. LDL is the type of cholesterol that causes fatty deposits (plaques) to build up in the blood vessels that carry blood away from the  heart (arteries). Low levels of HDL cholesterol. HDL cholesterol is the type of cholesterol that protects against heart disease. High levels of HDL remove the LDL buildup from arteries. High levels of triglycerides. Triglycerides are a fatty substance in the blood that is linked to a buildup of plaques in the arteries. What are the causes? There are two main types of dyslipidemia: primary and secondary. Primary dyslipidemia is caused by changes (mutations) in genes that are passed down through families (inherited). These mutations cause several types of dyslipidemia. Secondary dyslipidemia may be caused by various risk factors that can lead to the disease, such as lifestyle choices and certain medical conditions. What increases the risk? You are more likely to develop this condition if you are an older man or if you are a woman who has gone through menopause. Other risk factors include: Having a family history of dyslipidemia. Taking certain medicines, including birth control pills, steroids, some diuretics, and beta-blockers. Eating a diet high in saturated fat. Smoking cigarettes or excessive alcohol  intake. Having certain medical conditions such as diabetes, polycystic ovary syndrome (PCOS), kidney disease, liver disease, or hypothyroidism. Not exercising regularly. Being overweight or obese with too much belly fat. What are the signs or symptoms? In most cases, dyslipidemia does not usually cause any symptoms. In severe cases, very high lipid levels can cause: Fatty bumps under the skin (xanthomas). A white or gray ring around the black center (pupil) of the eye. Very high triglyceride levels can cause inflammation of the pancreas (pancreatitis). How is this diagnosed? Your health care  provider may diagnose dyslipidemia based on a routine blood test (fasting blood test). Because most people do not have symptoms of the condition, this blood testing (lipid profile) is done on adults age 48  and older and is repeated every 4-6 years. This test checks: Total cholesterol. This measures the total amount of cholesterol in your blood, including LDL cholesterol, HDL cholesterol, and triglycerides. A healthy number is below 200 mg/dL (4.82 mmol/L). LDL cholesterol. The target number for LDL cholesterol is different for each person, depending on individual risk factors. A healthy number is usually below 100 mg/dL (7.40 mmol/L). Ask your health care provider what your LDL cholesterol should be. HDL cholesterol. An HDL level of 60 mg/dL (8.44 mmol/L) or higher is best because it helps to protect against heart disease. A number below 40 mg/dL (8.96 mmol/L) for men or below 50 mg/dL (8.70 mmol/L) for women increases the risk for heart disease. Triglycerides. A healthy triglyceride number is below 150 mg/dL (8.30 mmol/L). If your lipid profile is abnormal, your health care provider may do other blood tests. How is this treated? Treatment depends on the type of dyslipidemia that you have and your other risk factors for heart disease and stroke. Your health care provider will have a target range for your lipid levels based on this information. Treatment for dyslipidemia starts with lifestyle changes, such as diet and exercise. Your health care provider may recommend that you: Get regular exercise. Make changes to your diet. Quit smoking if you smoke. Limit your alcohol  intake. If diet changes and exercise do not help you reach your goals, your health care provider may also prescribe medicine to lower lipids. The most commonly prescribed type of medicine lowers your LDL cholesterol (statin drug). If you have a high triglyceride level, your provider may prescribe another type of drug (fibrate) or an omega-3 fish oil supplement, or both. Follow these instructions at home: Eating and drinking  Follow instructions from your health care provider or dietitian about eating or drinking restrictions. Eat a  healthy diet as told by your health care provider. This can help you reach and maintain a healthy weight, lower your LDL cholesterol, and raise your HDL cholesterol. This may include: Limiting your calories, if you are overweight. Eating more fruits, vegetables, whole grains, fish, and lean meats. Limiting saturated fat, trans fat, and cholesterol. Do not drink alcohol  if: Your health care provider tells you not to drink. You are pregnant, may be pregnant, or are planning to become pregnant. If you drink alcohol : Limit how much you have to: 0-1 drink a day for women. 0-2 drinks a day for men. Know how much alcohol  is in your drink. In the [place] one drink equals one 12 oz bottle of beer (355 mL), one 5 oz glass of wine (148 mL), or one 1 oz glass of hard liquor (44 mL). Activity Get regular exercise. Start an exercise and strength training program as told by your health care provider. Ask your health care provider what activities are safe for you. Your health care provider may recommend: 30 minutes of aerobic activity 4-6 days a week. Brisk walking is an example of aerobic activity. Strength training 2 days a week. General instructions Do not use any products that contain nicotine or tobacco. These products include cigarettes, chewing tobacco, and vaping devices, such as e-cigarettes. If you need help quitting, ask your health care provider. Take over-the-counter and prescription medicines only as told by your health care provider. This includes supplements. Keep  all follow-up visits. This is important. Contact a health care provider if: You are having trouble sticking to your exercise or diet plan. You are struggling to quit smoking or to control your use of alcohol . Summary Dyslipidemia often involves a high level of cholesterol or triglycerides, which are types of lipids. Treatment depends on the type of dyslipidemia that you have and your other risk factors for heart disease and  stroke. Treatment for dyslipidemia starts with lifestyle changes, such as diet and exercise. Your health care provider may prescribe medicine to lower lipids. This information is not intended to replace advice given to you by your health care provider. Make sure you discuss any questions you have with your health care provider. Document Revised: 09/18/2021 Document Reviewed: 04/21/2020 Elsevier Patient Education  2025 [employer]. Major Depressive Disorder, Adult Major depressive disorder (MDD) is a mental health condition. People with this disorder feel very sad, hopeless, and lose interest in things. Symptoms last most of the day, almost every day, for 2 weeks. MDD can affect: Relationships. Work and school. Things you usually like to do. What are the causes? The cause of MDD is not known. What increases the risk? Having family members with depression. Being male. Family problems. Alcohol  or drug misuse. A lot of stress in your life, such as from: Living without basic needs such as food and housing. Being treated poorly because of race, sex, or religion (discrimination). Things that caused you pain as a child, especially if you lost a parent or were abused. Health and mental problems that you have had for a long time. What are the signs or symptoms? The main symptoms of this condition are: Being sad all the time. Being grouchy (irritable) all the time. Not enjoying the things you usually like. Sleeping too much or too little. Eating too much or too little. Feeling tired. Other symptoms include: Gaining or losing weight, without knowing why. Being restless and weak. Feeling hopeless, worthless, or guilty. Trouble thinking or making decisions. Thoughts of hurting yourself or others, or thoughts of ending your life. Spending a lot of time alone. Being unable to do daily tasks. If you have very bad MDD, you may: Believe things that are not true. Hear, see, taste, or feel  things that are not there. Have mild depression that lasts for at least 2 years. Feel very sad and hopeless. Have trouble speaking or moving. Feel very sad during some seasons. How is this treated? Talk therapy. This teaches you about thoughts, feelings, and actions and how to change them. This can also help you to talk with others. This can be done with members of your family. Medicines. Lifestyle changes. You may need to: Limit alcohol  use. Stop using drugs, if you use them. Exercise. Get plenty of sleep. Eat healthy. Spend more time outdoors. Brain stimulation. This may be done when symptoms are very bad or have not gotten better. Follow these instructions at home: Alcohol  use Do not drink alcohol  if: Your health care provider tells you not to drink. You are pregnant, may be pregnant, or are planning to become pregnant. If you drink alcohol : Limit how much you use to: 0-1 drink a day for women. 0-2 drinks a day for men. Know how much alcohol  is in your drink. In the [place] one drink equals one 12 oz bottle of beer (355 mL), one 5 oz glass of wine (148 mL), or one 1 oz glass of hard liquor (44 mL). Activity Exercise as told by your  doctor. Spend time outdoors. Make time to do the things you enjoy. Find ways to deal with stress. Try to: Meditate. Do deep breathing. Spend time in nature. Keep a journal. Return to your normal activities when your doctor says that it is safe. General instructions  Take over-the-counter and prescription medicines only as told by your doctor. Talk to your doctor about: Alcohol  use. It can affect medicines. Any drug use. Eat healthy foods. Get a lot of sleep. Think about joining a support group. Ask your doctor about that. Keep all follow-up visits. Your doctor will need to check on your mood, behavior, and medicines, and change your treatment as needed. Where to find more information: [employer] Mental Illness:  [employer] of Mental Health: bloggercourse.com American Psychiatric Association: psychiatry.org Contact a doctor if: You feel worse. You get new symptoms. Get help right away if: You hurt yourself on purpose (self-harm). You have thoughts about hurting yourself or others. You see, hear, taste, smell, or feel things that are not there. Get help right away if you feel like you may hurt yourself or others, or have thoughts about taking your own life. Go to your nearest emergency room or: Call 911. Call the National Suicide Prevention Lifeline at 617-681-4856 or 988. This is open 24 hours a day. Text the Crisis Text Line at 314-873-1397. This information is not intended to replace advice given to you by your health care provider. Make sure you discuss any questions you have with your health care provider. Document Revised: 06/23/2021 Document Reviewed: 06/23/2021 Elsevier Patient Education  2024 [employer] Alzheimer's Disease Caregiver Guide Alzheimer's disease is a condition that makes a person: Forget things. Act differently. Have trouble paying attention and doing simple tasks. Have trouble talking and responding to your questions. These things get worse with time. The tips below can help you care for the person. How to help manage lifestyle changes Tips to help with symptoms Be calm and patient. Give simple, short answers to questions. Long answers can confuse the person. Avoid correcting the person in a negative way. Do not argue with the person. This may make the person more upset. Try not to take things personally, even if the person forgets your name. Tips to lessen frustration Use simple words and a calm voice. Only give one direction at a time. Do daily tasks, like bathing and dressing, when the person is at their best. Also make any appointments for these times. Take your time. Simple tasks may take longer. Allow plenty of time to get tasks done. Limit choices  for the person. Too many choices can be stressful. Involve the person in what you're doing. Keep things organized: Keep a daily routine. Organize medicines in a pillbox for each day of the week. Keep a calendar in a central place. Use it to remind the person of health care visits or other activities. Avoid new or crowded places, if possible. Buy clothes and shoes for the person that are easy to put on and take off. Try to change the subject if the person becomes frustrated or angry. This is a great way to make things less tense. Tips to prevent injury  Keep floors clear. Remove rugs, magazine racks, and floor lamps. Keep hallways well-lit, especially at night. Put a handrail and nonslip mat in the bathtub or shower. Put childproof locks on cabinets that have dangerous items in them. These items include medicine, alcohol , guns, cleaning products, and sharp tools. Put locks on doors where  the person can't see or reach them. This helps keep the person from going out of the house and getting lost. Remove car keys and lock garage doors so the person doesn't try to drive. Be ready for emergencies. Keep a list of emergency phone numbers and addresses close by. Bracelets may be worn that track location and identify the person as having memory loss. This should be worn at all times for safety. Tips for the future  Discuss financial and legal planning early. Get help from a professional. People with this disease have trouble managing their money as the disease gets worse. Talk about advance directives, safety, and daily care. Take these steps: Create a living will and choose a power of attorney. This is someone who can make decisions for the person with Alzheimer's disease when they can no longer do so. Discuss driving safety and when to stop driving. The person's health care provider can help with this. If the person lives alone, make sure they're safe. Some people need extra help at home. Other  people need more care at a nursing home or care center. How to recognize changes in the person's condition With this disease, memory problems and confusion slowly get worse. In time, the person may not know their friends and family members. The disease can cause changes in behavior and mood, such as anger or feeling worried or nervous. The person may also hallucinate. This means they see, hear, taste, smell, or feel things that aren't real. These changes can come on all of a sudden. They may happen in response to something such as: Pain. An infection. Changes in temperature. Too much stimulation or noise. Feeling lost or scared. Medicines. Where to find support Find out about services that can provide short-term care for the person. This is called respite care. It can allow you to take a break when you need one. Join a support group near you. These groups can help you: Learn ways to manage stress. Share experiences with others. Get emotional comfort and support. Learn about caregiving as the disease gets worse. Find community resources. Where to find more information Alzheimer's Association: westerntunes.it Contact a health care provider if: The person has a fever. The person has a sudden change in behavior that doesn't get better when you try to help calm them. The person has a sudden increase in confusion or new hallucinations. The person is not able to take care of themselves at home. You are no longer able to care for the person. Get help right away if: You feel like the person may hurt themselves or others. The person has talked about taking their own life. These symptoms may be an emergency. Take one of these steps right away: Go to your nearest emergency room. Call 911. Call the National Suicide Prevention Lifeline at 916-500-8450 or 988. Text the Crisis Text Line at (432) 582-2515. This information is not intended to replace advice given to you by your health care provider. Make sure you  discuss any questions you have with your health care provider. Document Revised: 11/18/2022 Document Reviewed: 04/15/2022 Elsevier Patient Education  2024 [employer] Alzheimer's Disease Alzheimer's disease is a disease that affects the way your brain works. It affects memory and causes changes in how you think, talk, and act. The disease gets worse over time. Alzheimer's disease is a form of dementia. What are the causes? Alzheimer's disease happens when a protein called beta-amyloid forms deposits in the brain. It's not known what causes these to form.  The disease may also be caused by a harmful change in a gene that's passed down, or inherited, from one or both parents. Not everyone who gets the changed gene from their parents will get the disease. What increases the risk? Being older than 86 years of age. Being male. Having any of these conditions: High blood pressure. Diabetes. Heart or blood vessel disease. Smoking. Being very overweight. Having a brain injury or a stroke in the past. Having a family history of dementia. What are the signs or symptoms?  Symptoms may happen in three stages, which often overlap. Early stage In this stage, you can still do things on your own. You may still be able to drive, work, and be social. Symptoms in this stage include: Forgetting small things, like a name, words, or what you did recently. Having a hard time with: Paying attention or learning new things. Talking with people. Doing your usual tasks. Solving problems or doing math. Following instructions. Feeling worried or nervous. Not wanting to be around people. Losing interest in doing things. Moderate stage In this stage, you'll start to need care. Symptoms include: Trouble saying what you're thinking. Memory loss that affects daily life. This can include forgetting: Things that happened recently. If you've taken medicines or eaten. Places you know. You may get lost while  walking or driving. To pay bills. To bathe or use the bathroom. Being confused about where you are or what time it is. Trouble judging distance. Changes in how you feel or act. You may be moody, angry, upset, scared, worried, or suspicious. Not thinking clearly or making good choices. You may have delusions or false beliefs. Hallucinations. This means you see, hear, taste, smell, or feel things that aren't real. Severe stage In the severe stage, you'll need help with your personal care and daily activities. Symptoms include: Memory loss getting worse. Personality changes. Not knowing where you are. Physical problems, like trouble walking, sitting, or swallowing. More trouble talking with others. Not being able to control when you pee (urinate) or poop. More changes in how you act. How is this diagnosed? You may need to see a nervous system specialist, called a neurologist, or a health care provider who focuses on the care of older adults. Alzheimer's disease may be diagnosed based on: Your symptoms and medical history. Your provider will talk with you and your family, friends, and caregivers about your history and symptoms. A physical exam. Tests. These may include: Lab tests, such as tests on your blood or pee. Imaging tests. You may have a CT scan, a PET scan, or an MRI. A lumbar puncture. For this test, a sample of the fluid around the brain and spinal cord is taken and tested. Tests to check your thinking and memory. Genetic testing. This may be done if you get the disease before age 6 or if other family members have had the disease. How is this treated? At this time, there's no cure for Alzheimer's disease. The goals of treatment are to: Manage symptoms that affect behavior. Make sure you're safe at home. Help manage daily life for you and your caregivers. Treatment may include: Medicines. You may get medicines that can: Slow down how fast the disease gets worse. Help with  memory and behavior. Cognitive therapy. This gives you support, training to help with thinking skills, and memory aids. Counseling or spiritual guidance. These can help you deal with the many feelings you may have, such as fear, anger, or feeling alone. Caregivers. These  are people who help you with your daily tasks. Family support groups. These allow your family members to learn about the disease, get emotional support, and find out about resources to help take care of you. Follow these instructions at home:  Medicines Take over-the-counter and prescription medicines only as told by your provider. Use a pill organizer or pill reminder to help you keep track of your medicines. Avoid taking medicines that can affect thinking, such as medicines for pain or sleep. Lifestyle Make healthy choices: Be active as told by your provider. Regular exercise may help with symptoms. Do not smoke, vape, or use products with nicotine or tobacco in them. Do not drink alcohol . Eat a healthy diet. When you feel a lot of stress, do something that helps you relax. Try things like yoga or deep breathing. Spend time with people. Drink enough fluid to keep your pee pale yellow. Make sure you sleep well. These tips can help: Try not to take long naps during the day. Take short naps of 30 minutes or less if needed. Keep your bedroom dark and cool. Do not exercise during the few hours before you go to bed. Avoid caffeine products in the afternoon and evening. General instructions Work with your provider to decide: What things you need help with. What your safety needs are. If you were given a bracelet that tracks where you are and shows that you're a person with memory loss, make sure you wear it at all times. Talk with your provider about whether it's safe for you to drive. Work with your family to make big legal or health decisions. This may include things like advance directives, medical power of attorney, or  a living will. Where to find more information There are two ways to contact the Alzheimer's Association: Call the 24-hour helpline at 479-361-7858. Visit westerntunes.it Contact a health care provider if: Your medicine causes you to have nausea, vomiting, or trouble with eating. You have mood or behavior changes that are getting worse, such as feeling depressed, worried, or nervous. You have hallucinations. You or your family members are worried about your safety. You're hard to wake up. Your memory suddenly gets worse. Get help right away if: You feel like you may hurt yourself or others. You have thoughts about taking your own life. Take one of these steps: Go to your nearest emergency room. Call 911. Call the National Suicide Prevention Lifeline at 250 175 3492 or 988. Text the Crisis Text Line at 561-835-5690. This information is not intended to replace advice given to you by your health care provider. Make sure you discuss any questions you have with your health care provider. Document Revised: 05/18/2022 Document Reviewed: 05/18/2022 Elsevier Patient Education  2024 Elsevier Inc.  The above assessment and management plan was discussed with the patient. The patient verbalized understanding of and has agreed to the management plan. Patient is aware to call the clinic if they develop any new symptoms or if symptoms persist or worsen. Patient is aware when to return to the clinic for a follow-up visit. Patient educated on when it is appropriate to go to the emergency department.  Thressa Shiffer [place] Thompson, DNP [facility] [address] [place], [place] [address] [phone]    "

## 2023-11-16 ENCOUNTER — Encounter: Payer: Self-pay | Admitting: Nurse Practitioner

## 2023-11-16 ENCOUNTER — Ambulatory Visit (INDEPENDENT_AMBULATORY_CARE_PROVIDER_SITE_OTHER): Admitting: Nurse Practitioner

## 2023-11-16 VITALS — BP 123/72 | HR 56 | Temp 97.3°F | Ht 72.0 in | Wt 171.4 lb

## 2023-11-16 DIAGNOSIS — E785 Hyperlipidemia, unspecified: Secondary | ICD-10-CM

## 2023-11-16 DIAGNOSIS — I2510 Atherosclerotic heart disease of native coronary artery without angina pectoris: Secondary | ICD-10-CM

## 2023-11-16 DIAGNOSIS — F322 Major depressive disorder, single episode, severe without psychotic features: Secondary | ICD-10-CM

## 2023-11-16 DIAGNOSIS — Z111 Encounter for screening for respiratory tuberculosis: Secondary | ICD-10-CM

## 2023-11-16 DIAGNOSIS — I10 Essential (primary) hypertension: Secondary | ICD-10-CM | POA: Diagnosis not present

## 2023-11-16 DIAGNOSIS — G301 Alzheimer's disease with late onset: Secondary | ICD-10-CM

## 2023-11-16 DIAGNOSIS — F02C0 Dementia in other diseases classified elsewhere, severe, without behavioral disturbance, psychotic disturbance, mood disturbance, and anxiety: Secondary | ICD-10-CM

## 2023-11-16 DIAGNOSIS — E782 Mixed hyperlipidemia: Secondary | ICD-10-CM

## 2023-11-16 DIAGNOSIS — N320 Bladder-neck obstruction: Secondary | ICD-10-CM

## 2023-11-16 MED ORDER — TAMSULOSIN HCL 0.4 MG PO CAPS: 0.4000 mg | ORAL_CAPSULE | Freq: Every day | ORAL | 0 refills | Status: AC

## 2023-11-16 MED ORDER — ASPIRIN 81 MG PO TBEC: 81.0000 mg | DELAYED_RELEASE_TABLET | Freq: Every day | ORAL | 0 refills | Status: AC

## 2023-11-16 MED ORDER — SERTRALINE HCL 25 MG PO TABS: 25.0000 mg | ORAL_TABLET | Freq: Every day | ORAL | 0 refills | Status: AC

## 2023-11-16 MED ORDER — ATORVASTATIN CALCIUM 40 MG PO TABS: 40.0000 mg | ORAL_TABLET | Freq: Every day | ORAL | 0 refills | Status: AC

## 2023-11-16 MED ORDER — AMLODIPINE BESYLATE 5 MG PO TABS: 5.0000 mg | ORAL_TABLET | Freq: Every day | ORAL | 0 refills | Status: AC

## 2023-11-16 MED ORDER — QUETIAPINE FUMARATE 25 MG PO TABS: 25.0000 mg | ORAL_TABLET | Freq: Every day | ORAL | 0 refills | Status: AC

## 2023-11-19 LAB — QUANTIFERON-TB GOLD PLUS
QuantiFERON Mitogen Value: >10 [IU]/mL
QuantiFERON Nil Value: 0.39 [IU]/mL
QuantiFERON TB1 Ag Value: 0.46 [IU]/mL
QuantiFERON TB2 Ag Value: 0.49 [IU]/mL
QuantiFERON-TB Gold Plus: NEGATIVE

## 2023-11-21 ENCOUNTER — Ambulatory Visit: Payer: Self-pay | Admitting: Nurse Practitioner

## 2023-11-24 ENCOUNTER — Ambulatory Visit: Admitting: Nurse Practitioner

## 2023-12-07 ENCOUNTER — Encounter: Payer: Self-pay | Admitting: Neurology

## 2023-12-07 ENCOUNTER — Ambulatory Visit: Admitting: Neurology

## 2023-12-07 VITALS — BP 146/66 | HR 57 | Ht 72.0 in | Wt 173.0 lb

## 2023-12-07 DIAGNOSIS — H9190 Unspecified hearing loss, unspecified ear: Secondary | ICD-10-CM

## 2023-12-07 DIAGNOSIS — F02A0 Dementia in other diseases classified elsewhere, mild, without behavioral disturbance, psychotic disturbance, mood disturbance, and anxiety: Secondary | ICD-10-CM

## 2023-12-07 DIAGNOSIS — R569 Unspecified convulsions: Secondary | ICD-10-CM

## 2023-12-07 DIAGNOSIS — G301 Alzheimer's disease with late onset: Secondary | ICD-10-CM | POA: Diagnosis not present

## 2023-12-07 MED ORDER — MEMANTINE HCL 5 MG PO TABS: 5.0000 mg | ORAL_TABLET | Freq: Two times a day (BID) | ORAL | 3 refills | Status: AC

## 2023-12-07 NOTE — Patient Instructions (Addendum)
 Will obtain dementia labs including B12, TSH, and ATN profile  Continue current medications  Start with Namenda 5 mg nightly for 2 weeks then increase to 5 mg twice daily  Routine EEG  Referral to audiology for evaluation of hearing loss  Continue to follow up with PCP  Return as needed    There are well-accepted and sensible ways to reduce risk for Alzheimers disease and other degenerative brain disorders .  Exercise Daily Walk A daily 20 minute walk should be part of your routine. Disease related apathy can be a significant roadblock to exercise and the only way to overcome this is to make it a daily routine and perhaps have a reward at the end (something your loved one loves to eat or drink perhaps) or a personal trainer coming to the home can also be very useful. Most importantly, the patient is much more likely to exercise if the caregiver / spouse does it with him/her. In general a structured, repetitive schedule is best.  General Health: Any diseases which effect your body will effect your brain such as a pneumonia, urinary infection, blood clot, heart attack or stroke. Keep contact with your primary care doctor for regular follow ups.  Sleep. A good nights sleep is healthy for the brain. Seven hours is recommended. If you have insomnia or poor sleep habits we can give you some instructions. If you have sleep apnea wear your mask.  Diet: Eating a heart healthy diet is also a good idea; fish and poultry instead of red meat, nuts (mostly non-peanuts), vegetables, fruits, olive oil or canola oil (instead of butter), minimal salt (use other spices to flavor foods), whole grain rice, bread, cereal and pasta and wine in moderation.Research is now showing that the MIND diet, which is a combination of The Mediterranean diet and the DASH diet, is beneficial for cognitive processing and longevity. Information about this diet can be found in The MIND Diet, a book by Annitta Feeling, MS, RDN, and online at  WildWildScience.es  Finances, Power of [address] and Advance Directives: You should consider putting legal safeguards in place with regard to financial and medical decision making. While the spouse always has power of attorney for medical and financial issues in the absence of any form, you should consider what you want in case the spouse / caregiver is no longer around or capable of making decisions.

## 2023-12-07 NOTE — Progress Notes (Signed)
 [facility]  PATIENT: Frank West DOB: January 10, 1936  REQUESTING CLINICIAN: [facility] Hummer, [place]* HISTORY FROM: Daughter and some from patient  REASON FOR VISIT: Memory loss    HISTORICAL  CHIEF COMPLAINT:  Chief Complaint  Patient presents with   RM12/MEMORY    Pt is here with his Daughter.  Pt's daughter states that pt's memory has declined tremendously.      HISTORY OF PRESENT ILLNESS:  This is a 88 year old gentleman past medical history hypertension, hyperlipidemia, insomnia, depression, hearing loss, diagnosis of dementia in 2010 who is presenting with her daughter for worsening memory.  Daughter tells me that patient memory has been getting worse, he is more forgetful, repetitive, asking the same questions over and over.  Daughter feels that the long-term memory is stable but the short-term memory is really affected.  He also have severe hearing loss and he is not using hearing aids.  He has seen audiology in the past but has not followed up.   For the past 5 years he has been living with her daughter.  He is no longer cooking due to making mistake while cooking in the past.  He is able to shower and dress himself and walk his own dog.  He is able to feed himself and feed his dog but he does not cook.  Daughter is doing all the shopping, handling the bills.  She has also reported episodes of freezing gait where he will his eyes will open wide, he would shake a little bit and become unresponsive.  He has not fallen during these events because daughter is there to catch, afterward he feels tired and wants to sleep.    TBI:   No past history of TBI Stroke:   no past history of stroke Seizures:   no past history of seizures Sleep:   no history of sleep apnea.   Mood:  Yes, on Zoloft  Family history of Dementia: Mother with dementia   Functional status: dependent in all  ADLs Patient lives with daughter. Cooking: no since living with daughter   Cleaning: yes Shopping: some Bathing: no help needed Toileting: no help needed  Driving: no Bills: daughter Medications: daughter helps  Ever left the stove on by accident?: yes Forget how to use items around the house?: Yes Getting lost going to familiar places?: Yes Forgetting loved ones names?: Denies Word finding difficulty? Sometimes  Sleep: Ok    OTHER MEDICAL CONDITIONS: Hypertension, Hyperlipidemia, Depression, Insomnia    REVIEW OF SYSTEMS: Full 14 system review of systems performed and negative with exception of: as noted in the HPI   ALLERGIES: No Known Allergies  HOME MEDICATIONS: Outpatient Medications Prior to Visit  Medication Sig Dispense Refill   amLODipine  (NORVASC ) 5 MG tablet Take 1 tablet (5 mg total) by mouth daily. 90 tablet 0   aspirin  EC (ASPIRIN  81) 81 MG tablet Take 1 tablet (81 mg total) by mouth daily. 90 tablet 0   atorvastatin  (LIPITOR) 40 MG tablet Take 1 tablet (40 mg total) by mouth daily. 90 tablet 0   QUEtiapine  (SEROQUEL ) 25 MG tablet Take 1 tablet (25 mg total) by mouth at bedtime. 90 tablet 0   sertraline  (ZOLOFT ) 25 MG tablet Take 1 tablet (25 mg total) by mouth daily. 90 tablet 0   tamsulosin  (FLOMAX ) 0.4 MG CAPS capsule Take 1 capsule (0.4 mg total) by mouth daily after supper. 90 capsule 0   No facility-administered medications prior to visit.    PAST MEDICAL  HISTORY: Past Medical History:  Diagnosis Date   Alzheimer disease (HCC)    CAD (coronary artery disease)    a. s/p prior stent in 2017 but details unavailable    PAST SURGICAL HISTORY: Past Surgical History:  Procedure Laterality Date   CORONARY ANGIOPLASTY WITH STENT PLACEMENT     HIP ARTHROPLASTY Right 09/27/2022   Procedure: POSTERIOR HIP HEMIATHROPLASTY;  Surgeon: Edna Toribio LABOR, MD;  Location: [facility] OR;  Service: Orthopedics;  Laterality: Right;   IR TRANSCATHETER BX  2007   done in [place]     uroscopy  2011    FAMILY HISTORY: Family History  Problem  Relation Age of Onset   Melanoma Father    Obesity Brother     SOCIAL HISTORY: Social History   Socioeconomic History   Marital status: Widowed    Spouse name: Not on file   Number of children: Not on file   Years of education: Not on file   Highest education level: Not on file  Occupational History   Not on file  Tobacco Use   Smoking status: Former   Smokeless tobacco: Never  Vaping Use   Vaping status: Never Used  Substance and Sexual Activity   Alcohol  use: Never   Drug use: Never   Sexual activity: Not on file  Other Topics Concern   Not on file  Social History Narrative   Not on file   Social Drivers of Health   Financial Resource Strain: Not on file  Food Insecurity: No Food Insecurity (04/18/2023)   Hunger Vital Sign    Worried About Running Out of Food in the Last Year: Never true    Ran Out of Food in the Last Year: Never true  Transportation Needs: No Transportation Needs (04/18/2023)   PRAPARE - Administrator, Civil Service (Medical): No    Lack of Transportation (Non-Medical): No  Physical Activity: Not on file  Stress: Not on file  Social Connections: Unknown (04/18/2023)   Social Connection and Isolation Panel    Frequency of Communication with Friends and Family: More than three times a week    Frequency of Social Gatherings with Friends and Family: Three times a week    Attends Religious Services: Never    Active Member of Clubs or Organizations: No    Attends Banker Meetings: Never    Marital Status: Not on file  Intimate Partner Violence: Not At Risk (04/18/2023)   Humiliation, Afraid, Rape, and Kick questionnaire    Fear of Current or Ex-Partner: No    Emotionally Abused: No    Physically Abused: No    Sexually Abused: No    PHYSICAL EXAM   GENERAL EXAM/CONSTITUTIONAL: Vitals:  Vitals:   12/07/23 1003  BP: (!) 146/66  Pulse: (!) 57  SpO2: 97%  Weight: 173 lb (78.5 kg)  Height: 6' (1.829 m)   Body mass  index is 23.46 kg/m. Wt Readings from Last 3 Encounters:  12/07/23 173 lb (78.5 kg)  11/16/23 171 lb 6.4 oz (77.7 kg)  08/01/23 175 lb (79.4 kg)   Patient is in no distress; well developed, nourished and groomed; neck is supple, hard of hearing  MUSCULOSKELETAL: Gait, strength, tone, movements noted in Neurologic exam below  NEUROLOGIC: MENTAL STATUS:     12/07/2023   10:08 AM  MMSE - Mini Mental State Exam  Orientation to time 2  Orientation to Place 3  Registration 3  Attention/ Calculation 1  Recall 0  Language- name 2  objects 2  Language- repeat 0  Language- follow 3 step command 3  Language- read & follow direction 1  Write a sentence 1  Copy design 0  Total score 16   awake, alert, oriented to person, Difficulty with recent memory  language fluent, comprehension intact, naming intact fund of knowledge appropriate  CRANIAL NERVE:  2nd, 3rd, 4th, 6th- visual fields full to confrontation, extraocular muscles intact, no nystagmus 5th - facial sensation symmetric 7th - facial strength symmetric 8th - hearing intact 9th - palate elevates symmetrically, uvula midline 11th - shoulder shrug symmetric 12th - tongue protrusion midline  MOTOR:  normal bulk and tone, full strength in the BUE, BLE  SENSORY:  normal and symmetric to light touch  COORDINATION:  finger-nose-finger, fine finger movements normal  GAIT/STATION:  normal   DIAGNOSTIC DATA (LABS, IMAGING, TESTING) - I reviewed patient records, labs, notes, testing and imaging myself where available.  Lab Results  Component Value Date   WBC 8.3 04/19/2023   HGB 13.4 04/19/2023   HCT 40.8 04/19/2023   MCV 93.8 04/19/2023   PLT 152 04/19/2023      Component Value Date/Time   NA 135 04/21/2023 0326   NA 140 01/05/2023 1220   K 3.8 04/21/2023 0326   CL 104 04/21/2023 0326   CO2 23 04/21/2023 0326   GLUCOSE 123 (H) 04/21/2023 0326   BUN 24 (H) 04/21/2023 0326   BUN 18 01/05/2023 1220    CREATININE 1.24 04/21/2023 0326   CALCIUM  8.3 (L) 04/21/2023 0326   PROT 7.7 04/17/2023 2045   PROT 7.1 01/05/2023 1220   ALBUMIN  3.7 04/17/2023 2045   ALBUMIN  4.1 01/05/2023 1220   AST 37 04/17/2023 2045   ALT 40 04/17/2023 2045   ALKPHOS 73 04/17/2023 2045   BILITOT 1.7 (H) 04/17/2023 2045   BILITOT 0.8 01/05/2023 1220   GFRNONAA 57 (L) 04/21/2023 0326   GFRAA 57 (L) 07/17/2019 1633   Lab Results  Component Value Date   CHOL 175 01/05/2023   HDL 41 01/05/2023   LDLCALC 117 (H) 01/05/2023   TRIG 93 01/05/2023   CHOLHDL 4.3 01/05/2023   No results found for: HGBA1C No results found for: VITAMINB12 Lab Results  Component Value Date   TSH 1.690 01/05/2023    CT head 04/17/2023 No acute intracranial abnormality.    ASSESSMENT AND PLAN  88 y.o. year old male with medical conditions including hypertension, hyperlipidemia, depression, dementia who is presenting with worsening dementia.  He does live with daughter who helps with his activity of daily living.  He is still able to bathe and dress himself and feed himself and walk his dog.  But he is very repetitive, forgetful, asking the same questions.  On exam, he scored a 16 out of 30 on the MMSE indicative of impairment.  Patient also does have severe hearing loss, does not use hearing aid which contributes to his memory loss.  Plan will be for him to follow-up with audiology for evaluation of hearing loss.  I will get him a referral.  We will obtain dementia lab including B12, TSH, and ATN profile to look for Alzheimer disease biomarkers and I will also get a routine EEG for his seizure-like episodes.  I will contact him to go over the results, advised him to follow-up with PCP and return sooner if worse.     1. Mild late onset Alzheimer's dementia without behavioral disturbance, psychotic disturbance, mood disturbance, or anxiety (HCC)   2. Seizure-like activity (HCC)  3. Hard of hearing      Patient Instructions  Will  obtain dementia labs including B12, TSH, and ATN profile  Continue current medications  Start with Namenda 5 mg nightly for 2 weeks then increase to 5 mg twice daily  Routine EEG  Referral to audiology for evaluation of hearing loss  Continue to follow up with PCP  Return as needed    There are well-accepted and sensible ways to reduce risk for Alzheimers disease and other degenerative brain disorders .  Exercise Daily Walk A daily 20 minute walk should be part of your routine. Disease related apathy can be a significant roadblock to exercise and the only way to overcome this is to make it a daily routine and perhaps have a reward at the end (something your loved one loves to eat or drink perhaps) or a personal trainer coming to the home can also be very useful. Most importantly, the patient is much more likely to exercise if the caregiver / spouse does it with him/her. In general a structured, repetitive schedule is best.  General Health: Any diseases which effect your body will effect your brain such as a pneumonia, urinary infection, blood clot, heart attack or stroke. Keep contact with your primary care doctor for regular follow ups.  Sleep. A good nights sleep is healthy for the brain. Seven hours is recommended. If you have insomnia or poor sleep habits we can give you some instructions. If you have sleep apnea wear your mask.  Diet: Eating a heart healthy diet is also a good idea; fish and poultry instead of red meat, nuts (mostly non-peanuts), vegetables, fruits, olive oil or canola oil (instead of butter), minimal salt (use other spices to flavor foods), whole grain rice, bread, cereal and pasta and wine in moderation.Research is now showing that the MIND diet, which is a combination of The Mediterranean diet and the DASH diet, is beneficial for cognitive processing and longevity. Information about this diet can be found in The MIND Diet, a book by Annitta Feeling, MS, RDN, and online at  WildWildScience.es  Finances, Power of [address] and Advance Directives: You should consider putting legal safeguards in place with regard to financial and medical decision making. While the spouse always has power of attorney for medical and financial issues in the absence of any form, you should consider what you want in case the spouse / caregiver is no longer around or capable of making decisions.     Orders Placed This Encounter  Procedures   Vitamin B12   TSH   ATN PROFILE   Ambulatory referral to Audiology   EEG adult    Meds ordered this encounter  Medications   memantine (NAMENDA) 5 MG tablet    Sig: Take 1 tablet (5 mg total) by mouth 2 (two) times daily.    Dispense:  180 tablet    Refill:  3    Return if symptoms worsen or fail to improve.  I have spent a total of 65 minutes dedicated to this patient today, preparing to see patient, performing a medically appropriate examination and evaluation, ordering tests and/or medications and procedures, and counseling and educating the patient/family/caregiver; independently interpreting result and communicating results to the family/patient/caregiver; and documenting clinical information in the electronic medical record.   Pastor Falling, MD 12/07/2023, 5:11 PM  [facility] [address] [place], [place] [address] [phone]

## 2023-12-08 ENCOUNTER — Telehealth: Payer: Self-pay | Admitting: Neurology

## 2023-12-08 NOTE — Telephone Encounter (Signed)
 Referral to Audiology faxed to [facility]    [facility]  [phone] Fax: [phone]

## 2023-12-10 LAB — ATN PROFILE
A -- Beta-amyloid 42/40 Ratio: 0.106 (ref >0.102)
Beta-amyloid 40: 237.02 pg/mL
Beta-amyloid 42: 25.19 pg/mL
N -- NfL, Plasma: 3.65 pg/mL (ref 0.00–9.13)
T -- p-tau181: 2.03 pg/mL — ABNORMAL HIGH (ref 0.00–0.97)

## 2023-12-10 LAB — TSH: TSH: 2.07 u[IU]/mL (ref 0.450–4.500)

## 2023-12-10 LAB — VITAMIN B12: Vitamin B-12: 666 pg/mL (ref 232–1245)

## 2023-12-12 ENCOUNTER — Ambulatory Visit: Payer: Self-pay | Admitting: Neurology

## 2023-12-15 ENCOUNTER — Ambulatory Visit

## 2023-12-16 ENCOUNTER — Ambulatory Visit

## 2023-12-26 ENCOUNTER — Other Ambulatory Visit: Admitting: *Deleted

## 2024-01-09 ENCOUNTER — Ambulatory Visit: Admitting: Neurology

## 2024-01-09 DIAGNOSIS — R569 Unspecified convulsions: Secondary | ICD-10-CM

## 2024-01-09 DIAGNOSIS — R4182 Altered mental status, unspecified: Secondary | ICD-10-CM

## 2024-01-09 DIAGNOSIS — F02A0 Dementia in other diseases classified elsewhere, mild, without behavioral disturbance, psychotic disturbance, mood disturbance, and anxiety: Secondary | ICD-10-CM

## 2024-01-09 NOTE — Procedures (Signed)
    History:  88 year old man with dementia and seizure like activity   EEG classification: Awake and drowsy  Duration: 28 minutes   Technical aspects: This EEG study was done with scalp electrodes positioned according to the 10-20 International system of electrode placement. Electrical activity was reviewed with band pass filter of 1-70Hz , sensitivity of 7 uV/mm, display speed of 75mm/sec with a 60Hz  notched filter applied as appropriate. EEG data were recorded continuously and digitally stored.   Description of the recording: The background rhythms of this recording consists of a fairly well modulated medium amplitude alpha rhythm of 8 Hz that is reactive to eye opening and closure. Present in the anterior head region is a 15-20 Hz beta activity. Photic stimulation was performed, did not show any abnormalities. Hyperventilation was not performed. Drowsiness was manifested by background fragmentation. No abnormal epileptiform discharges seen during this recording. There was intermittent left temporal focal slowing. There were no electrographic seizure identified.   Abnormality: Intermittent left temporal focal slowing   Impression: This EEG is suggestive of neuronal dysfunction in the left temporal region. There were no interictal epileptiform discharges and no seizures.    Arel Tippen, MD [facility]

## 2024-01-31 ENCOUNTER — Telehealth: Payer: Self-pay | Admitting: Nurse Practitioner

## 2024-01-31 NOTE — Telephone Encounter (Unsigned)
 Copied from CRM #[id number]. Topic: Clinical - Medical Advice >> Jan 31, 2024 12:20 PM Everette C wrote: Reason for CRM: Dishane Wood with [facility] has called to verify the patient's current allergies. Please contact Dishane Further at [phone] when possible

## 2024-01-31 NOTE — Telephone Encounter (Signed)
 If rehab facility calls back, no known allergies are documented in patients chart.

## 2024-02-20 ENCOUNTER — Ambulatory Visit: Payer: Self-pay | Admitting: Nurse Practitioner

## 2024-02-20 ENCOUNTER — Encounter: Payer: Self-pay | Admitting: Nurse Practitioner

## 2024-03-23 ENCOUNTER — Ambulatory Visit: Payer: Self-pay
